# Patient Record
Sex: Female | Born: 1950
Health system: Southern US, Community
[De-identification: ages and names within clinical notes are randomized; demographics above are authoritative.]

## PROBLEM LIST (undated history)

## (undated) ENCOUNTER — Ambulatory Visit

## (undated) DIAGNOSIS — K219 Gastro-esophageal reflux disease without esophagitis: Secondary | ICD-10-CM

## (undated) DIAGNOSIS — C50919 Malignant neoplasm of unspecified site of unspecified female breast: Secondary | ICD-10-CM

## (undated) DIAGNOSIS — M1990 Unspecified osteoarthritis, unspecified site: Secondary | ICD-10-CM

## (undated) DIAGNOSIS — I10 Essential (primary) hypertension: Secondary | ICD-10-CM

## (undated) DIAGNOSIS — Z87442 Personal history of urinary calculi: Secondary | ICD-10-CM

## (undated) DIAGNOSIS — H269 Unspecified cataract: Secondary | ICD-10-CM

## (undated) DIAGNOSIS — Z5189 Encounter for other specified aftercare: Secondary | ICD-10-CM

## (undated) DIAGNOSIS — E119 Type 2 diabetes mellitus without complications: Secondary | ICD-10-CM

## (undated) HISTORY — PX: EYE SURGERY: SHX253

## (undated) HISTORY — PX: BREAST SURGERY: SHX581

## (undated) HISTORY — PX: CHOLECYSTECTOMY: SHX55

## (undated) HISTORY — PX: TUBAL LIGATION: SHX77

## (undated) HISTORY — DX: Type 2 diabetes mellitus without complications: E11.9

## (undated) HISTORY — DX: Gastro-esophageal reflux disease without esophagitis: K21.9

## (undated) HISTORY — PX: MASTECTOMY: SHX3

## (undated) HISTORY — DX: Unspecified cataract: H26.9

## (undated) HISTORY — DX: Unspecified osteoarthritis, unspecified site: M19.90

## (undated) HISTORY — DX: Encounter for other specified aftercare: Z51.89

---

## 1998-08-13 ENCOUNTER — Ambulatory Visit (HOSPITAL_COMMUNITY): Admission: RE | Admit: 1998-08-13 | Discharge: 1998-08-13 | Payer: Self-pay | Admitting: *Deleted

## 1999-04-18 ENCOUNTER — Ambulatory Visit (HOSPITAL_COMMUNITY): Admission: RE | Admit: 1999-04-18 | Discharge: 1999-04-18 | Payer: Self-pay | Admitting: *Deleted

## 2000-01-29 ENCOUNTER — Encounter: Payer: Self-pay | Admitting: Oncology

## 2000-01-29 ENCOUNTER — Encounter: Admission: RE | Admit: 2000-01-29 | Discharge: 2000-01-29 | Payer: Self-pay | Admitting: Oncology

## 2000-07-06 ENCOUNTER — Encounter: Admission: RE | Admit: 2000-07-06 | Discharge: 2000-07-06 | Payer: Self-pay | Admitting: Oncology

## 2000-07-06 ENCOUNTER — Encounter: Payer: Self-pay | Admitting: Oncology

## 2000-08-30 ENCOUNTER — Encounter: Admission: RE | Admit: 2000-08-30 | Discharge: 2000-09-04 | Payer: Self-pay | Admitting: Pulmonary Disease

## 2000-12-09 ENCOUNTER — Encounter: Payer: Self-pay | Admitting: Oncology

## 2000-12-09 ENCOUNTER — Encounter: Admission: RE | Admit: 2000-12-09 | Discharge: 2000-12-09 | Payer: Self-pay | Admitting: Oncology

## 2001-03-10 ENCOUNTER — Ambulatory Visit (HOSPITAL_COMMUNITY): Admission: RE | Admit: 2001-03-10 | Discharge: 2001-03-10 | Payer: Self-pay | Admitting: Pulmonary Disease

## 2001-04-14 ENCOUNTER — Ambulatory Visit (HOSPITAL_COMMUNITY): Admission: RE | Admit: 2001-04-14 | Discharge: 2001-04-14 | Payer: Self-pay | Admitting: Pulmonary Disease

## 2001-05-03 ENCOUNTER — Other Ambulatory Visit: Admission: RE | Admit: 2001-05-03 | Discharge: 2001-05-03 | Payer: Self-pay | Admitting: Dermatology

## 2001-07-16 ENCOUNTER — Encounter: Payer: Self-pay | Admitting: Podiatry

## 2001-07-19 ENCOUNTER — Ambulatory Visit (HOSPITAL_COMMUNITY): Admission: RE | Admit: 2001-07-19 | Discharge: 2001-07-19 | Payer: Self-pay | Admitting: Podiatry

## 2001-07-19 ENCOUNTER — Encounter: Payer: Self-pay | Admitting: Podiatry

## 2001-07-20 ENCOUNTER — Observation Stay (HOSPITAL_COMMUNITY): Admission: AD | Admit: 2001-07-20 | Discharge: 2001-07-21 | Payer: Self-pay | Admitting: Pulmonary Disease

## 2001-07-22 ENCOUNTER — Encounter (HOSPITAL_COMMUNITY): Admission: RE | Admit: 2001-07-22 | Discharge: 2001-08-21 | Payer: Self-pay | Admitting: Pulmonary Disease

## 2001-08-30 ENCOUNTER — Encounter: Payer: Self-pay | Admitting: Oncology

## 2001-08-30 ENCOUNTER — Ambulatory Visit (HOSPITAL_COMMUNITY): Admission: RE | Admit: 2001-08-30 | Discharge: 2001-08-30 | Payer: Self-pay | Admitting: Oncology

## 2002-03-21 ENCOUNTER — Ambulatory Visit (HOSPITAL_COMMUNITY): Admission: RE | Admit: 2002-03-21 | Discharge: 2002-03-21 | Payer: Self-pay | Admitting: Pulmonary Disease

## 2002-07-15 ENCOUNTER — Ambulatory Visit (HOSPITAL_COMMUNITY): Admission: RE | Admit: 2002-07-15 | Discharge: 2002-07-15 | Payer: Self-pay | Admitting: Pulmonary Disease

## 2002-08-31 ENCOUNTER — Encounter: Payer: Self-pay | Admitting: *Deleted

## 2002-08-31 ENCOUNTER — Ambulatory Visit (HOSPITAL_COMMUNITY): Admission: RE | Admit: 2002-08-31 | Discharge: 2002-08-31 | Payer: Self-pay | Admitting: *Deleted

## 2003-04-21 ENCOUNTER — Ambulatory Visit (HOSPITAL_COMMUNITY): Admission: RE | Admit: 2003-04-21 | Discharge: 2003-04-21 | Payer: Self-pay | Admitting: Pulmonary Disease

## 2003-05-02 ENCOUNTER — Encounter (HOSPITAL_COMMUNITY): Admission: RE | Admit: 2003-05-02 | Discharge: 2003-06-01 | Payer: Self-pay | Admitting: Pulmonary Disease

## 2003-05-19 ENCOUNTER — Observation Stay (HOSPITAL_COMMUNITY): Admission: RE | Admit: 2003-05-19 | Discharge: 2003-05-20 | Payer: Self-pay | Admitting: General Surgery

## 2003-09-04 ENCOUNTER — Ambulatory Visit (HOSPITAL_COMMUNITY): Admission: RE | Admit: 2003-09-04 | Discharge: 2003-09-04 | Payer: Self-pay | Admitting: Hematology and Oncology

## 2004-01-09 ENCOUNTER — Encounter (HOSPITAL_COMMUNITY): Admission: RE | Admit: 2004-01-09 | Discharge: 2004-04-08 | Payer: Self-pay | Admitting: Hematology and Oncology

## 2004-06-27 ENCOUNTER — Ambulatory Visit (HOSPITAL_COMMUNITY): Admission: RE | Admit: 2004-06-27 | Discharge: 2004-06-27 | Payer: Self-pay | Admitting: Obstetrics & Gynecology

## 2004-07-08 ENCOUNTER — Other Ambulatory Visit: Admission: RE | Admit: 2004-07-08 | Discharge: 2004-07-08 | Payer: Self-pay | Admitting: Obstetrics & Gynecology

## 2004-08-12 ENCOUNTER — Ambulatory Visit (HOSPITAL_COMMUNITY): Admission: RE | Admit: 2004-08-12 | Discharge: 2004-08-12 | Payer: Self-pay

## 2004-08-20 ENCOUNTER — Ambulatory Visit (HOSPITAL_COMMUNITY): Admission: RE | Admit: 2004-08-20 | Discharge: 2004-08-20 | Payer: Self-pay | Admitting: Pulmonary Disease

## 2004-08-27 ENCOUNTER — Ambulatory Visit (HOSPITAL_COMMUNITY): Admission: RE | Admit: 2004-08-27 | Discharge: 2004-08-27 | Payer: Self-pay | Admitting: Pulmonary Disease

## 2004-09-02 ENCOUNTER — Ambulatory Visit (HOSPITAL_COMMUNITY): Admission: RE | Admit: 2004-09-02 | Discharge: 2004-09-02 | Payer: Self-pay | Admitting: Hematology and Oncology

## 2004-09-04 ENCOUNTER — Ambulatory Visit: Payer: Self-pay | Admitting: Orthopedic Surgery

## 2004-09-26 ENCOUNTER — Ambulatory Visit: Payer: Self-pay | Admitting: Orthopedic Surgery

## 2004-10-16 ENCOUNTER — Ambulatory Visit: Payer: Self-pay | Admitting: Orthopedic Surgery

## 2004-10-25 ENCOUNTER — Ambulatory Visit (HOSPITAL_COMMUNITY): Admission: RE | Admit: 2004-10-25 | Discharge: 2004-10-25 | Payer: Self-pay | Admitting: Orthopaedic Surgery

## 2004-10-25 ENCOUNTER — Ambulatory Visit: Payer: Self-pay | Admitting: Orthopedic Surgery

## 2004-10-28 ENCOUNTER — Ambulatory Visit: Payer: Self-pay | Admitting: Orthopedic Surgery

## 2004-10-29 ENCOUNTER — Encounter (HOSPITAL_COMMUNITY): Admission: RE | Admit: 2004-10-29 | Discharge: 2004-11-28 | Payer: Self-pay | Admitting: Orthopedic Surgery

## 2004-11-20 ENCOUNTER — Ambulatory Visit: Payer: Self-pay | Admitting: Orthopedic Surgery

## 2004-11-29 ENCOUNTER — Encounter (HOSPITAL_COMMUNITY): Admission: RE | Admit: 2004-11-29 | Discharge: 2004-12-29 | Payer: Self-pay | Admitting: Orthopedic Surgery

## 2004-12-16 ENCOUNTER — Ambulatory Visit: Payer: Self-pay | Admitting: Orthopedic Surgery

## 2005-04-21 ENCOUNTER — Ambulatory Visit (HOSPITAL_COMMUNITY): Admission: RE | Admit: 2005-04-21 | Discharge: 2005-04-21 | Payer: Self-pay

## 2005-09-04 ENCOUNTER — Ambulatory Visit (HOSPITAL_COMMUNITY): Admission: RE | Admit: 2005-09-04 | Discharge: 2005-09-04 | Payer: Self-pay | Admitting: Pulmonary Disease

## 2005-09-27 ENCOUNTER — Emergency Department (HOSPITAL_COMMUNITY): Admission: EM | Admit: 2005-09-27 | Discharge: 2005-09-27 | Payer: Self-pay | Admitting: Emergency Medicine

## 2005-09-30 ENCOUNTER — Ambulatory Visit (HOSPITAL_COMMUNITY): Admission: RE | Admit: 2005-09-30 | Discharge: 2005-09-30 | Payer: Self-pay | Admitting: Pulmonary Disease

## 2005-12-29 ENCOUNTER — Ambulatory Visit (HOSPITAL_COMMUNITY): Admission: RE | Admit: 2005-12-29 | Discharge: 2005-12-29 | Payer: Self-pay | Admitting: Pulmonary Disease

## 2006-05-27 ENCOUNTER — Ambulatory Visit (HOSPITAL_COMMUNITY): Admission: RE | Admit: 2006-05-27 | Discharge: 2006-05-27 | Payer: Self-pay | Admitting: Pulmonary Disease

## 2006-09-07 ENCOUNTER — Ambulatory Visit (HOSPITAL_COMMUNITY): Admission: RE | Admit: 2006-09-07 | Discharge: 2006-09-07 | Payer: Self-pay | Admitting: Hematology and Oncology

## 2006-09-16 ENCOUNTER — Ambulatory Visit (HOSPITAL_COMMUNITY): Admission: RE | Admit: 2006-09-16 | Discharge: 2006-09-16 | Payer: Self-pay | Admitting: Hematology and Oncology

## 2006-10-20 HISTORY — PX: COLONOSCOPY: SHX174

## 2006-10-29 ENCOUNTER — Ambulatory Visit (HOSPITAL_COMMUNITY): Admission: RE | Admit: 2006-10-29 | Discharge: 2006-10-29 | Payer: Self-pay | Admitting: Neurology

## 2006-11-15 ENCOUNTER — Emergency Department (HOSPITAL_COMMUNITY): Admission: EM | Admit: 2006-11-15 | Discharge: 2006-11-15 | Payer: Self-pay | Admitting: Emergency Medicine

## 2006-12-17 ENCOUNTER — Encounter: Admission: RE | Admit: 2006-12-17 | Discharge: 2006-12-17 | Payer: Self-pay | Admitting: Neurology

## 2007-01-10 ENCOUNTER — Emergency Department (HOSPITAL_COMMUNITY): Admission: EM | Admit: 2007-01-10 | Discharge: 2007-01-10 | Payer: Self-pay | Admitting: Emergency Medicine

## 2007-01-13 ENCOUNTER — Ambulatory Visit (HOSPITAL_COMMUNITY): Admission: RE | Admit: 2007-01-13 | Discharge: 2007-01-13 | Payer: Self-pay | Admitting: Pulmonary Disease

## 2007-05-20 ENCOUNTER — Ambulatory Visit (HOSPITAL_COMMUNITY): Admission: RE | Admit: 2007-05-20 | Discharge: 2007-05-20 | Payer: Self-pay | Admitting: Pulmonary Disease

## 2007-05-27 ENCOUNTER — Ambulatory Visit: Payer: Self-pay | Admitting: Internal Medicine

## 2007-06-10 ENCOUNTER — Ambulatory Visit: Payer: Self-pay | Admitting: Internal Medicine

## 2007-06-10 ENCOUNTER — Encounter: Payer: Self-pay | Admitting: Internal Medicine

## 2007-06-10 ENCOUNTER — Ambulatory Visit (HOSPITAL_COMMUNITY): Admission: RE | Admit: 2007-06-10 | Discharge: 2007-06-10 | Payer: Self-pay | Admitting: Internal Medicine

## 2007-09-10 ENCOUNTER — Ambulatory Visit (HOSPITAL_COMMUNITY): Admission: RE | Admit: 2007-09-10 | Discharge: 2007-09-10 | Payer: Self-pay | Admitting: Hematology and Oncology

## 2008-03-20 ENCOUNTER — Ambulatory Visit (HOSPITAL_COMMUNITY): Admission: RE | Admit: 2008-03-20 | Discharge: 2008-03-20 | Payer: Self-pay | Admitting: Pulmonary Disease

## 2008-03-21 ENCOUNTER — Ambulatory Visit: Payer: Self-pay | Admitting: Cardiology

## 2008-07-19 ENCOUNTER — Ambulatory Visit (HOSPITAL_COMMUNITY): Admission: RE | Admit: 2008-07-19 | Discharge: 2008-07-19 | Payer: Self-pay | Admitting: Pulmonary Disease

## 2008-09-20 ENCOUNTER — Ambulatory Visit (HOSPITAL_COMMUNITY): Admission: RE | Admit: 2008-09-20 | Discharge: 2008-09-20 | Payer: Self-pay | Admitting: Hematology and Oncology

## 2008-11-22 ENCOUNTER — Encounter: Admission: RE | Admit: 2008-11-22 | Discharge: 2008-11-22 | Payer: Self-pay | Admitting: Otolaryngology

## 2009-05-02 ENCOUNTER — Ambulatory Visit (HOSPITAL_COMMUNITY): Admission: RE | Admit: 2009-05-02 | Discharge: 2009-05-02 | Payer: Self-pay | Admitting: Pulmonary Disease

## 2009-10-08 ENCOUNTER — Ambulatory Visit (HOSPITAL_COMMUNITY): Admission: RE | Admit: 2009-10-08 | Discharge: 2009-10-08 | Payer: Self-pay | Admitting: Pulmonary Disease

## 2009-10-16 DIAGNOSIS — M19041 Primary osteoarthritis, right hand: Secondary | ICD-10-CM | POA: Insufficient documentation

## 2009-10-16 DIAGNOSIS — G589 Mononeuropathy, unspecified: Secondary | ICD-10-CM | POA: Insufficient documentation

## 2009-10-16 DIAGNOSIS — M129 Arthropathy, unspecified: Secondary | ICD-10-CM | POA: Insufficient documentation

## 2009-10-16 DIAGNOSIS — I152 Hypertension secondary to endocrine disorders: Secondary | ICD-10-CM | POA: Insufficient documentation

## 2009-10-16 DIAGNOSIS — I10 Essential (primary) hypertension: Secondary | ICD-10-CM

## 2009-10-16 DIAGNOSIS — K219 Gastro-esophageal reflux disease without esophagitis: Secondary | ICD-10-CM

## 2009-10-16 DIAGNOSIS — G43909 Migraine, unspecified, not intractable, without status migrainosus: Secondary | ICD-10-CM | POA: Insufficient documentation

## 2009-10-16 DIAGNOSIS — C50919 Malignant neoplasm of unspecified site of unspecified female breast: Secondary | ICD-10-CM | POA: Insufficient documentation

## 2010-01-09 ENCOUNTER — Ambulatory Visit (HOSPITAL_COMMUNITY): Admission: RE | Admit: 2010-01-09 | Discharge: 2010-01-09 | Payer: Self-pay | Admitting: Pulmonary Disease

## 2010-08-22 ENCOUNTER — Ambulatory Visit (HOSPITAL_COMMUNITY)
Admission: RE | Admit: 2010-08-22 | Discharge: 2010-08-22 | Payer: Self-pay | Source: Home / Self Care | Admitting: Pulmonary Disease

## 2010-09-17 ENCOUNTER — Encounter
Admission: RE | Admit: 2010-09-17 | Discharge: 2010-09-17 | Payer: Self-pay | Source: Home / Self Care | Admitting: Surgery

## 2010-09-30 ENCOUNTER — Encounter
Admission: RE | Admit: 2010-09-30 | Discharge: 2010-09-30 | Payer: Self-pay | Source: Home / Self Care | Attending: Surgery | Admitting: Surgery

## 2010-10-02 ENCOUNTER — Ambulatory Visit
Admission: RE | Admit: 2010-10-02 | Discharge: 2010-10-02 | Payer: Self-pay | Source: Home / Self Care | Attending: Surgery | Admitting: Surgery

## 2010-10-10 ENCOUNTER — Ambulatory Visit (HOSPITAL_COMMUNITY): Admission: RE | Admit: 2010-10-10 | Payer: Self-pay | Source: Home / Self Care | Admitting: Hematology and Oncology

## 2010-11-10 ENCOUNTER — Encounter: Payer: Self-pay | Admitting: Hematology and Oncology

## 2010-11-19 ENCOUNTER — Other Ambulatory Visit: Payer: Self-pay | Admitting: Hematology and Oncology

## 2010-11-19 DIAGNOSIS — Z9011 Acquired absence of right breast and nipple: Secondary | ICD-10-CM

## 2010-12-31 LAB — COMPREHENSIVE METABOLIC PANEL
ALT: 27 U/L (ref 0–35)
Albumin: 3.5 g/dL (ref 3.5–5.2)
BUN: 17 mg/dL (ref 6–23)
CO2: 27 mEq/L (ref 19–32)
Calcium: 9.6 mg/dL (ref 8.4–10.5)
Creatinine, Ser: 0.76 mg/dL (ref 0.4–1.2)
GFR calc Af Amer: >60 mL/min (ref >60)
GFR calc non Af Amer: >60 mL/min (ref >60)
Potassium: 4.7 mEq/L (ref 3.5–5.1)
Sodium: 139 mEq/L (ref 135–145)
Total Bilirubin: 0.6 mg/dL (ref 0.3–1.2)

## 2010-12-31 LAB — DIFFERENTIAL
Basophils Absolute: 0 10*3/uL (ref 0.0–0.1)
Basophils Relative: 0 % (ref 0–1)
Eosinophils Absolute: 0.1 10*3/uL (ref 0.0–0.7)
Neutro Abs: 5 10*3/uL (ref 1.7–7.7)

## 2010-12-31 LAB — CBC
Hemoglobin: 13.7 g/dL (ref 12.0–15.0)
MCV: 88.8 fL (ref 78.0–100.0)
RBC: 4.66 MIL/uL (ref 3.87–5.11)
RDW: 14.7 % (ref 11.5–15.5)

## 2011-01-13 ENCOUNTER — Ambulatory Visit
Admission: RE | Admit: 2011-01-13 | Discharge: 2011-01-13 | Disposition: A | Discharge status: Home / Self Care | Payer: Medicare Other | Source: Ambulatory Visit | Attending: Hematology and Oncology | Admitting: Hematology and Oncology

## 2011-01-13 DIAGNOSIS — Z9011 Acquired absence of right breast and nipple: Secondary | ICD-10-CM

## 2011-01-16 DIAGNOSIS — R002 Palpitations: Secondary | ICD-10-CM

## 2011-01-21 ENCOUNTER — Other Ambulatory Visit: Payer: Self-pay | Admitting: Hematology and Oncology

## 2011-01-21 DIAGNOSIS — Z853 Personal history of malignant neoplasm of breast: Secondary | ICD-10-CM

## 2011-01-21 DIAGNOSIS — Z1231 Encounter for screening mammogram for malignant neoplasm of breast: Secondary | ICD-10-CM

## 2011-01-27 ENCOUNTER — Emergency Department (HOSPITAL_COMMUNITY)
Admission: EM | Admit: 2011-01-27 | Discharge: 2011-01-27 | Disposition: A | Discharge status: Home / Self Care | Payer: Medicare Other | Attending: Emergency Medicine | Admitting: Emergency Medicine

## 2011-01-27 ENCOUNTER — Emergency Department (HOSPITAL_COMMUNITY): Payer: Medicare Other

## 2011-01-27 ENCOUNTER — Encounter (HOSPITAL_COMMUNITY): Payer: Self-pay | Admitting: Radiology

## 2011-01-27 DIAGNOSIS — G8929 Other chronic pain: Secondary | ICD-10-CM | POA: Insufficient documentation

## 2011-01-27 DIAGNOSIS — Z79899 Other long term (current) drug therapy: Secondary | ICD-10-CM | POA: Insufficient documentation

## 2011-01-27 DIAGNOSIS — M545 Low back pain, unspecified: Secondary | ICD-10-CM | POA: Insufficient documentation

## 2011-01-27 DIAGNOSIS — R109 Unspecified abdominal pain: Secondary | ICD-10-CM | POA: Insufficient documentation

## 2011-01-27 DIAGNOSIS — C50919 Malignant neoplasm of unspecified site of unspecified female breast: Secondary | ICD-10-CM | POA: Insufficient documentation

## 2011-01-27 DIAGNOSIS — Z853 Personal history of malignant neoplasm of breast: Secondary | ICD-10-CM | POA: Insufficient documentation

## 2011-01-27 HISTORY — DX: Malignant neoplasm of unspecified site of unspecified female breast: C50.919

## 2011-01-27 LAB — URINALYSIS, ROUTINE W REFLEX MICROSCOPIC
Bilirubin Urine: NEGATIVE
Glucose, UA: NEGATIVE mg/dL
Hgb urine dipstick: NEGATIVE
Ketones, ur: NEGATIVE mg/dL
Specific Gravity, Urine: >1.03 — ABNORMAL HIGH (ref 1.005–1.030)
Urobilinogen, UA: 0.2 mg/dL (ref 0.0–1.0)

## 2011-02-08 ENCOUNTER — Emergency Department (HOSPITAL_COMMUNITY): Payer: Medicare Other

## 2011-02-08 ENCOUNTER — Emergency Department (HOSPITAL_COMMUNITY)
Admission: EM | Admit: 2011-02-08 | Discharge: 2011-02-08 | Disposition: A | Discharge status: Home / Self Care | Payer: Medicare Other | Attending: Emergency Medicine | Admitting: Emergency Medicine

## 2011-02-08 DIAGNOSIS — R071 Chest pain on breathing: Secondary | ICD-10-CM | POA: Insufficient documentation

## 2011-02-08 DIAGNOSIS — Z853 Personal history of malignant neoplasm of breast: Secondary | ICD-10-CM | POA: Insufficient documentation

## 2011-02-08 DIAGNOSIS — R109 Unspecified abdominal pain: Secondary | ICD-10-CM | POA: Insufficient documentation

## 2011-02-08 DIAGNOSIS — I10 Essential (primary) hypertension: Secondary | ICD-10-CM | POA: Insufficient documentation

## 2011-02-08 LAB — URINALYSIS, ROUTINE W REFLEX MICROSCOPIC
Bilirubin Urine: NEGATIVE
Glucose, UA: NEGATIVE mg/dL
Hgb urine dipstick: NEGATIVE
Ketones, ur: NEGATIVE mg/dL
Protein, ur: NEGATIVE mg/dL
Urobilinogen, UA: 0.2 mg/dL (ref 0.0–1.0)

## 2011-02-08 LAB — COMPREHENSIVE METABOLIC PANEL
ALT: 28 U/L (ref 0–35)
AST: 24 U/L (ref 0–37)
CO2: 24 mEq/L (ref 19–32)
Chloride: 102 mEq/L (ref 96–112)
Creatinine, Ser: 0.67 mg/dL (ref 0.4–1.2)
GFR calc Af Amer: >60 mL/min (ref >60)
GFR calc non Af Amer: >60 mL/min (ref >60)
Glucose, Bld: 93 mg/dL (ref 70–99)
Total Bilirubin: 0.6 mg/dL (ref 0.3–1.2)

## 2011-02-08 LAB — DIFFERENTIAL
Basophils Relative: 0 % (ref 0–1)
Lymphocytes Relative: 29 % (ref 12–46)
Lymphs Abs: 2 10*3/uL (ref 0.7–4.0)
Monocytes Absolute: 0.4 10*3/uL (ref 0.1–1.0)
Monocytes Relative: 5 % (ref 3–12)
Neutro Abs: 4.5 10*3/uL (ref 1.7–7.7)
Neutrophils Relative %: 64 % (ref 43–77)

## 2011-02-08 LAB — CBC
HCT: 41.3 % (ref 36.0–46.0)
Hemoglobin: 13.7 g/dL (ref 12.0–15.0)
MCH: 28.8 pg (ref 26.0–34.0)
MCHC: 33.2 g/dL (ref 30.0–36.0)
RBC: 4.75 MIL/uL (ref 3.87–5.11)

## 2011-02-08 LAB — LIPASE, BLOOD: Lipase: 17 U/L (ref 11–59)

## 2011-02-08 LAB — URINE MICROSCOPIC-ADD ON

## 2011-02-17 ENCOUNTER — Other Ambulatory Visit (HOSPITAL_COMMUNITY): Payer: Self-pay | Admitting: Pulmonary Disease

## 2011-02-17 DIAGNOSIS — S2232XA Fracture of one rib, left side, initial encounter for closed fracture: Secondary | ICD-10-CM

## 2011-02-18 ENCOUNTER — Encounter (HOSPITAL_COMMUNITY): Payer: Self-pay

## 2011-02-18 ENCOUNTER — Encounter (HOSPITAL_COMMUNITY): Payer: Medicare Other

## 2011-02-18 ENCOUNTER — Encounter (HOSPITAL_COMMUNITY)
Admission: RE | Admit: 2011-02-18 | Discharge: 2011-02-18 | Disposition: A | Discharge status: Home / Self Care | Payer: Medicare Other | Source: Ambulatory Visit | Attending: Pulmonary Disease | Admitting: Pulmonary Disease

## 2011-02-18 ENCOUNTER — Other Ambulatory Visit (HOSPITAL_COMMUNITY): Payer: Self-pay | Admitting: Pulmonary Disease

## 2011-02-18 DIAGNOSIS — S2232XA Fracture of one rib, left side, initial encounter for closed fracture: Secondary | ICD-10-CM

## 2011-02-18 DIAGNOSIS — Z853 Personal history of malignant neoplasm of breast: Secondary | ICD-10-CM | POA: Insufficient documentation

## 2011-02-18 DIAGNOSIS — R0789 Other chest pain: Secondary | ICD-10-CM | POA: Insufficient documentation

## 2011-02-18 DIAGNOSIS — M549 Dorsalgia, unspecified: Secondary | ICD-10-CM | POA: Insufficient documentation

## 2011-02-18 HISTORY — DX: Essential (primary) hypertension: I10

## 2011-02-18 MED ORDER — TECHNETIUM TC 99M MEDRONATE IV KIT
25.0000 | PACK | Freq: Once | INTRAVENOUS | Status: AC | PRN
  Administered 2011-02-18: 23.5 via INTRAVENOUS

## 2011-02-25 ENCOUNTER — Other Ambulatory Visit (HOSPITAL_COMMUNITY): Payer: Self-pay | Admitting: Pulmonary Disease

## 2011-02-25 DIAGNOSIS — R109 Unspecified abdominal pain: Secondary | ICD-10-CM

## 2011-02-25 DIAGNOSIS — M549 Dorsalgia, unspecified: Secondary | ICD-10-CM

## 2011-02-26 ENCOUNTER — Ambulatory Visit (HOSPITAL_COMMUNITY)
Admission: RE | Admit: 2011-02-26 | Discharge: 2011-02-26 | Disposition: A | Discharge status: Home / Self Care | Payer: Medicare Other | Source: Ambulatory Visit | Attending: Pulmonary Disease | Admitting: Pulmonary Disease

## 2011-02-26 DIAGNOSIS — R109 Unspecified abdominal pain: Secondary | ICD-10-CM

## 2011-02-26 DIAGNOSIS — M5124 Other intervertebral disc displacement, thoracic region: Secondary | ICD-10-CM | POA: Insufficient documentation

## 2011-02-26 DIAGNOSIS — R10A0 Flank pain, unspecified side: Secondary | ICD-10-CM

## 2011-02-26 DIAGNOSIS — R1032 Left lower quadrant pain: Secondary | ICD-10-CM | POA: Insufficient documentation

## 2011-02-26 DIAGNOSIS — K7689 Other specified diseases of liver: Secondary | ICD-10-CM | POA: Insufficient documentation

## 2011-02-26 DIAGNOSIS — Z853 Personal history of malignant neoplasm of breast: Secondary | ICD-10-CM | POA: Insufficient documentation

## 2011-02-26 DIAGNOSIS — M546 Pain in thoracic spine: Secondary | ICD-10-CM | POA: Insufficient documentation

## 2011-02-26 DIAGNOSIS — M549 Dorsalgia, unspecified: Secondary | ICD-10-CM

## 2011-02-26 DIAGNOSIS — K5730 Diverticulosis of large intestine without perforation or abscess without bleeding: Secondary | ICD-10-CM | POA: Insufficient documentation

## 2011-02-26 MED ORDER — IOHEXOL 300 MG/ML  SOLN
100.0000 mL | Freq: Once | INTRAMUSCULAR | Status: AC | PRN
  Administered 2011-02-26: 100 mL via INTRAVENOUS

## 2011-03-03 ENCOUNTER — Encounter: Payer: Medicare Other | Admitting: Genetic Counselor

## 2011-03-04 NOTE — Consult Note (Signed)
NAME:  Lauren Sullivan, Lauren Sullivan                  ACCOUNT NO.:  [id number]   MEDICAL RECORD NO.:  [id number]          PATIENT TYPE:  AMB   LOCATION:  DAY                           FACILITY:  [facility]   PHYSICIAN:  R. Roetta Sessions, M.D. DATE OF BIRTH:  21-Apr-1951   DATE OF CONSULTATION:  05/27/2007  DATE OF DISCHARGE:                                 CONSULTATION   REASON FOR CONSULTATION:  Abdominal pain, diarrhea.   HISTORY OF PRESENT ILLNESS:  Lauren Sullivan is a 60 year old Caucasian female who  presents today for further evaluation of above stated symptoms.  For 6  weeks or more, she has had diarrhea and abdominal pain.  She is having  approximately 3-4 days a week where she has up to 10 stools a day which  are associated with fresh blood.  She describes the bleeding as small  volume.  Other days, she may have a lesser number of stools, but stools  are still soft.  She denies any constipation.  She has mid abdominal  pain which is intermittent in nature.  Does not tend to be relieved with  defecation.  She has a great appetite.  No nausea or vomiting or weight  loss.  Her GERD symptoms are well-controlled on Aciphex.  She denies any  new medications.  No recent antibiotic use.  The only thing new she  remembers, in May 2008 she received epidural injections in her neck for  bulging disk.  She had a CT of the abdomen and pelvis last week which  revealed fatty infiltration of the liver but nothing to explain her  symptoms.  She reports having blood work and stool studies which were  unremarkable as well.  We are trying retrieve these records.  Her last  colonoscopy was done in [place] back in 1994.  She does not recall  any abnormal findings.   CURRENT MEDICATIONS:  1. Micardis 80/25 mg daily.  2. Naproxen 500 mg b.i.d.  3. Nortriptyline 50 mg daily .  4. Evista 60 mg daily.  5. Aciphex 20 mg daily.  6. Topamax 50 mg daily.  7. Lortab 500 mg p.r.n.   ALLERGIES:  No known drug allergies.   PAST  MEDICAL HISTORY:  1. History of remote right breast cancer status post mastectomy and      chemotherapy in early 90s.  2. She has neuropathy, felt to be related to prior chemotherapy.  3. Arthritis.  4. Hypertension.  5. GERD.  6. Migraine headaches.  7. A ruptured disk in her neck.  8. She had a cholecystectomies about 4 years ago.  9. Right knee arthroscopy 2 years ago.  10.Colonoscopy in 1994 as outlined above.   FAMILY HISTORY:  Mother deceased at age 71 due to pancreatic cancer.  Father deceased age 89 due to accidental shooting.  She has a sister who  died from metastatic lung cancer.  No family history of colorectal  cancer.   SOCIAL HISTORY:  She is married.  She has two grown daughters.  She  works part-time at [employer].  Her husband is the  minister  there.  She is a remote smoker, but only smoked for couple of years in  her 59s.  Denies any alcohol use.   REVIEW OF SYSTEMS:  See HPI for GI and Constitutional.  CARDIOPULMONARY:  She denies any chest pain or shortness of breath.  GENITOURINARY:  She  denies any dysuria, hematuria.   PHYSICAL EXAMINATION:  VITAL SIGNS:  Weight 245 pounds, height 5 feet 6  inches, temperature 98.4, blood pressure 118/90, pulse 84.  GENERAL:  Pleasant, well-nourished, well-developed Caucasian female in  no acute distress.  SKIN:  Warm and dry.  No jaundice.  HEENT:  Sclerae nonicteric.  Oropharyngeal and mucosa moist and pink.  No lesions, erythema or exudate.  No lymphadenopathy, thyromegaly.  CHEST:  Lungs clear to auscultation.  CARDIAC:  Exam reveals regular rate and rhythm.  Normal S1-S2.  No  murmurs, rubs or gallops.  ABDOMEN:  Positive bowel sounds, soft, nontender, nondistended.  No  organomegaly or masses.  No guarding.  No abdominal bruits or hernias.  She had mild lower abdominal tenderness to deep palpation.  No rebound  tenderness or guarding.  No abdominal bruits or hernias.  EXTREMITIES:  No edema.    IMPRESSION:  Lauren Sullivan is a 60 year old lady with a 6-week history of  diarrhea associated with a lower abdominal pain and hematochezia.  She  is on NSAIDS chronically, therefore, cannot exclude the possibility of  NSAID induced colitis.  We need to rule out other etiologies such as IBD  or infectious colitis as well.   PLAN:  1. Colonoscopy with Dr. Jena Gauss in the near future.  2. Will retrieve stool studies and labs done in Dr. Juanetta Gosling office      recently.  3. Check a CBC and celiac disease, antibody panel.  Screening for      celiac disease, and if any antibodies are positive, would consider      upper endoscopy with small bowel biopsy at time of colonoscopy.  4. Please note, the patient had voiced serious concerns about having      difficulties with sedation.  She has been on      multiple narcotics throughout the past and finds that she has      previously been difficult to sedate and is requesting anesthesia      with propofol for colonoscopy.  This was discussed with Dr. Jena Gauss      who agrees with the plan.  I would like to thank Dr. Juanetta Gosling for      allowing [place] to take part in the care of this patient.      Tana Coast, P.AJonathon Bellows, M.D.  Electronically Signed    LL/MEDQ  D:  05/27/2007  T:  05/27/2007  Job:  [id number]   cc:   Ramon Dredge L. Juanetta Gosling, M.D.  Fax: [phone]

## 2011-03-04 NOTE — Op Note (Signed)
NAME:  [name], Lauren Sullivan                  ACCOUNT NO.:  [id number]   MEDICAL RECORD NO.:  [id number]          PATIENT TYPE:  AMB   LOCATION:  DAY                           FACILITY:  [facility]   PHYSICIAN:  R. Roetta Sessions, M.D. DATE OF BIRTH:  July 04, 1951   DATE OF PROCEDURE:  06/10/2007  DATE OF DISCHARGE:                               OPERATIVE REPORT   PROCEDURE:  Colonoscopy with ileoscopy, sigmoid biopsy, stool sampling.   INDICATIONS FOR PROCEDURE:  The patient is a 60 year old lady with a  couple months history of diarrhea with intermittent episodes of small  volume hematochezia.  Colonoscopy is now being done to further evaluate  her symptoms.  Her last colonoscopy was around 64 in [place],  findings unknown, old records never materialized, although we have  attempted to get them.   DESCRIPTION OF PROCEDURE:  O2 saturation, blood pressure, pulse rate,  and respirations were monitored throughout the entire procedure.  Sedation was administered by anesthesia (propofol).   INSTRUMENT USED:  Pentax video chip system.   FINDINGS:  Digital rectal examination revealed no abnormalities.  Endoscopic findings revealed the prep was good.  Examination of the  colonic mucosa undertaken from the rectosigmoid junction through the  left, transverse, right colon, to the area of the appendiceal orifice,  ileocecal valve, and cecum.  These structures were well seen and  photographed for the record.  The terminal ileum was intubated to 20 cm.  From this level, the scope was slowly withdrawn.  All previously  mentioned mucosal surfaces were again seen.  The terminal ileum mucosa  appeared slightly boggy and edematous.  This may well be a normal  variant, certainly no erosions or ulcerations were seen.  Biopsies of  the terminal ileum mucosa was taken for histologic study.  The colon,  however, appeared entirely normal without any evidence of erosion,  ulceration, or granularity, preservation of the  normal vascular pattern  throughout the colon.  Several biopsies of the descending and sigmoid  were taken for histologic study.  A stool sample was also taken for  microbiology studies.  The scope was pulled down into the rectum where a  thorough examination of the rectal mucosa including retroflexion in the  anal verge demonstrated no abnormalities, although the mucosa to the  anal canal was somewhat friable.  Biopsies of the rectal mucosa was also  taken.  The patient tolerated the procedure well.   IMPRESSION:  Minimally friable anal canal, otherwise, normal rectum.  Endoscopically normal appearing colon.  Questionably boggy edematous  terminal ileum mucosa of uncertain significance, status post segmental  biopsy and stool sampling.   RECOMMENDATIONS:  Will follow up on path and stool studies, further  recommendations in the very near future.      Jonathon Bellows, M.D.  Electronically Signed     RMR/MEDQ  D:  06/10/2007  T:  06/10/2007  Job:  [id number]   cc:   Ramon Dredge L. Juanetta Gosling, M.D.  Fax: [phone]

## 2011-03-05 ENCOUNTER — Encounter: Payer: Self-pay | Admitting: Cardiology

## 2011-03-07 NOTE — Op Note (Signed)
NAMEJORDANA, [name]                  ACCOUNT NO.:  [id number]   MEDICAL RECORD NO.:  [id number]          PATIENT TYPE:  AMB   LOCATION:  DAY                           FACILITY:  [facility]   PHYSICIAN:  Vickki Hearing, M.D.DATE OF BIRTH:  May 01, 1951   DATE OF PROCEDURE:  10/25/2004  DATE OF DISCHARGE:                                 OPERATIVE REPORT   PREOPERATIVE DIAGNOSIS:  Medial meniscal tear, right knee.   POSTOPERATIVE DIAGNOSIS:  1.  Medial meniscal tear, right knee, two loose bodies right knee,      arthritis, right knee.   OPERATION PERFORMED:  Partial medial meniscectomy, removal of two loose  bodies and chondroplasty of the patella.   SURGEON:  Vickki Hearing, M.D.   ANESTHESIA:  General.   INDICATIONS FOR PROCEDURE:  Pain, right knee.   DESCRIPTION OF PROCEDURE:  The patient was identified in the preop holding  area as Lauren Sullivan.  The mark placed on her right knee by the patient was  counter signed by the physician.  She was given preoperative Ancef and taken  to the operating room for general anesthetic.  After this, a sterile prep  and drape was done and a time out was taken and completed as required.  During the time out, we confirmed antibiotics to be given, equipment in the  room, right knee as a surgical site, arthroscopy as the procedure and  patient as Lauren Sullivan.   Standard two incision arthroscopy technique was performed.  Diagnostic  arthroscopy was performed with viewing from the lateral portal.  In the  medial compartment, a torn medial meniscus was noted at the posterior horn.  There was a radial tear.  There was a loose body found at that time.  In the  notch area, there were some mild osteophytes and synovitis around the ACL.  The lateral compartment had minimal fraying and irregularities of the  articular surface but for the most part was benign.  The patellofemoral area  had to be debrided for visualization and there was a grade 2 lesion  on the  median ridge.   We removed the loose body and a second loose body was found.  We did a  medial meniscectomy with a straight and curved duck bill forceps and  balanced the meniscus with a motorized shaver.  We suctioned the knee of the  debris and turned our attention to the patellofemoral joint.  After the  debridement, it was noted that the trochlea and patella had chondral  lesions, grade 1 on the trochlea, grade 2 on the patella.  A straight  motorized shaver was used to perform a chondroplasty of the patella.  There  was also a decreased amount of cartilage on the lateral facet.  It was  diffuse and I would grade it as grade 3.   The knee was then irrigated, suctioned dry and injected with 30 mL of  Marcaine.  The portal sites were closed with 3-0 nylon and the dressings  were applied with an Ace bandage.  Cryocuff was applied as well.  The  patient was extubated and taken to recovery room in stable condition.   POSTOP PLAN:  Follow-up on Monday which is three days from today.     Weyman Croon   SEH/MEDQ  D:  10/25/2004  T:  10/25/2004  Job:  [id number]

## 2011-03-07 NOTE — Op Note (Signed)
NAME:  Lauren Sullivan, Lauren Sullivan                            ACCOUNT NO.:  [id number]   MEDICAL RECORD NO.:  [id number]                   PATIENT TYPE:  AMB   LOCATION:  DAY                                  FACILITY:  [facility]   PHYSICIAN:  Dalia Heading, M.D.               DATE OF BIRTH:  1951-02-25   DATE OF PROCEDURE:  05/19/2003  DATE OF DISCHARGE:                                 OPERATIVE REPORT   PREOPERATIVE DIAGNOSIS:  Chronic cholecystitis.   POSTOPERATIVE DIAGNOSIS:  Chronic cholecystitis.   PROCEDURE:  Laparoscopic cholecystectomy.   SURGEON:  Dalia Heading, M.D.   ANESTHESIA:  General endotracheal.   INDICATIONS FOR PROCEDURE:  The patient is a 60 year old white female who  was referred for evaluation and treatment of biliary colic secondary to  chronic cholecystitis. The risks and benefits of the procedure including  bleeding, infection, hepatobiliary injury, and the possibility of an open  procedure were fully explained to the patient, who gave informed consent.   DESCRIPTION OF PROCEDURE:  The patient was placed in the supine position.  After induction of general endotracheal anesthesia, the abdomen was prepped  and draped using the usual sterile technique with Betadine. Surgical site  confirmation was performed.   A supraumbilical incision was made down to the fascia. A Veress needle was  introduced into the abdominal cavity and confirmation of placement was done  using the saline drop test. The abdomen was then insufflated to 16 mmHg  pressure. An 11 mm trocar was introduced into the abdominal cavity under  direct visualization without difficulty. An additional 11 mm trocar was  placed in the epigastric region and 5 mm trocars were placed in the right  upper quadrant and right flank regions. The liver was inspected and noted to  be within normal limits. The gallbladder was retracted superiorly and  laterally. The dissection was begun around the infundibulum of the  gallbladder. The cystic duct was first identified. Its junction to the  infundibulum fully identified. Endoclips were placed proximally and distally  on the cystic duct and the cystic duct was divided. This was likewise done  on the cystic artery. The gallbladder was then freed away from the  gallbladder fossa using Bovie electrocautery. The gallbladder was delivered  through the epigastric trocar site using an EndoCatch bag. The gallbladder  fossa was inspected and no abnormal bleeding or bile leakage was noted.  Surgicel was placed in the gallbladder fossa. The subhepatic space as well  as the right hepatic gutter were irrigated with fluid. All air was then  evacuated from the abdominal cavity prior to removal of the trocars.   All wounds were irrigated with normal saline. All wounds were injected with  0.5% Sensorcaine. The supraumbilical fascia was reapproximated using an #0  Vicryl interrupted suture. All skin incisions were closed using staples.  Betadine ointment and dry sterile dressings were applied.  All tape and needle counts were correct at the end of the procedure. The  patient was extubated in the operating room and went back to the recovery  room awake in stable condition.   COMPLICATIONS:  None.   ESTIMATED BLOOD LOSS:  Minimal.                                               Dalia Heading, M.D.    MAJ/MEDQ  D:  05/19/2003  T:  05/19/2003  Job:  [id number]   cc:   Ramon Dredge L. Juanetta Gosling, M.D.  [address]  [place]  [place] [address]  Fax: [phone]

## 2011-03-07 NOTE — H&P (Signed)
   NAME:  Lauren Sullivan, Lauren Sullivan                            ACCOUNT NO.:  [id number]   MEDICAL RECORD NO.:  [id number]                  PATIENT TYPE:   LOCATION:                                       FACILITY:   PHYSICIAN:  Dalia Heading, M.D.               DATE OF BIRTH:  05/30/1936   DATE OF ADMISSION:  05/19/2003  DATE OF DISCHARGE:                                HISTORY & PHYSICAL   CHIEF COMPLAINT:  Chronic cholecystitis.   HISTORY OF PRESENT ILLNESS:  The patient is a 60 year old white female who  was referred for evaluation and treatment of biliary colic secondary to  chronic cholecystitis.  She has been having right upper quadrant abdominal  discomfort, nausea, postprandial discomfort, indigestion, and bloating for a  while.  No fever, chills, or jaundice have been noted.  The symptoms seem to  be worsening.   PAST MEDICAL HISTORY:  Right breast cancer.   PAST SURGICAL HISTORY:  Right modified radical mastectomy.   CURRENT MEDICATIONS:  1. Evista 60 mg p.o. daily.  2. Pamelor 50 mg p.o. b.i.d.  3. Penicillin 250 mg p.o. b.i.d.  4. Vicodin as needed for migraine headaches.  5. Aciphex 20 mg p.o. daily.  6. Naprosyn 500 mg p.o. b.i.d.   ALLERGIES:  VANCOMYCIN.   REVIEW OF SYSTEMS:  Noncontributory.   PHYSICAL EXAMINATION:  GENERAL APPEARANCE:  The patient is a well-developed,  well-nourished, white female in no acute distress.  VITAL SIGNS:  She is afebrile and vital signs are stable.  HEENT:  No sclerae icterus.  LUNGS:  Clear to auscultation with equal breath sounds bilaterally.  HEART:  Regular rate and rhythm without S3, S4, or murmurs.  ABDOMEN:  Soft and nondistended.  Slightly tender in the right upper  quadrant to palpation.  No hepatosplenomegaly, masses, or hernias are  identified.  Ultrasound of the gallbladder is negative.  Hepatobiliary scan  reveals chronic cholecystitis with a low gallbladder ejection fraction.   IMPRESSION:  1. Biliary colic.  2.  Chronic cholecystitis.   PLAN:  The patient is scheduled for laparoscopic cholecystectomy on May 19, 2003.  The risks and benefits of the procedure, including bleeding,  infection, hepatobiliary injury, and the possibility of an open procedure  were fully explained to the patient, who gave informed consent.                                               Dalia Heading, M.D.    MAJ/MEDQ  D:  05/11/2003  T:  05/11/2003  Job:  [id number]   cc:   Ramon Dredge L. Juanetta Gosling, M.D.  [address]  [place]  [place] [address]  Fax: [phone]

## 2011-03-07 NOTE — H&P (Signed)
[facility]  Patient:    Lauren Sullivan, Lauren Sullivan Visit Number: [id number] MRN: [id number]          Service Type: OBV Location: 3 A321 01 Attending Physician:  Fredirick Maudlin Dictated by:   Kari Baars, M.D. Admit Date:  07/20/2001   CC:         Dr. Pricilla Holm   History and Physical  BRIEF HISTORY:  This is a 60 year old who underwent bunion surgery yesterday. She has had difficulty with wearing a number of different shoes, and has requested surgical correction.  CURRENT MEDICATIONS: 1. Evista. 2. Naproxen. 3. Vicodin. 4. Pamelor. 5. Aciphex. 6. Penicillin on a daily basis because of a previous history of severe    cellulitis which was recurrent.  PAST MEDICAL HISTORY:  She has had breast cancer and has had cellulitis twice. She has has no transfusions. She does have a history of reflux esophagitis for which takes the Aciphex, arthritis for which she takes the Naprosyn, and she takes Evista for bone density purposes.  FAMILY HISTORY:  Positive for breast cancer.  PHYSICAL EXAMINATION:  GENERAL:  She has a rash on her neck with some flushing of the face.  VITAL SIGNS:  Temperature at home was 100.0.  Her blood pressure 120/70, pulse is 80.  CHEST:  Fairly clear.  HEART:  Regular.  ABDOMEN:  Soft.  EXTREMITIES:  No edema.  She has some rash on right upper arm and on her right chest wall.  BREASTS:  She has had a mastectomy.  Further history is that she had her surgery uneventfully yesterday but then she developed the rash.  She is concerned that she is developing another episode of cellulitis.  ASSESSMENT:  She has possible cellulitis.  PLAN:  I am going to put her in the hospital for observation and have her take a intravenous antibiotic and will have her follow up after that.  She is to be reevaluated after the first 24 hours of antibiotic therapy. Dictated by:   Kari Baars, M.D. Attending Physician:  Fredirick Maudlin DD:   07/20/01 TD:  07/20/01 Job: [id number]

## 2011-03-07 NOTE — H&P (Signed)
Lauren Sullivan, Lauren Sullivan                  ACCOUNT NO.:  [id number]   MEDICAL RECORD NO.:  [id number]          PATIENT TYPE:  AMB   LOCATION:  DAY                           FACILITY:  [facility]   PHYSICIAN:  Vickki Hearing, M.D.DATE OF BIRTH:  08-19-1951   DATE OF ADMISSION:  09/02/2004  DATE OF DISCHARGE:  11/14/2005LH                                HISTORY & PHYSICAL   CHIEF COMPLAINT:  Right knee pain.   This is a history and physical for surgery.   This is a 60 year old female who complains of gradual onset of right knee  pain with no obvious trauma.  She complains of swelling and diffuse pain  across the front and back of the knee with aching, stiffness and difficulty  walking, flexing and extending the knee.  MRI shows torn medial meniscus,  joint effusion with a Baker's cyst and  three-compartment arthritis.   She was treated conservatively.  She failed conservative therapy and now  presents for arthroscopy of the right knee.   REVIEW OF SYSTEMS:  Notable for a history of lymph node cancer, right-sided,  history of infection after the surgery, seasonal allergy, poor vision, joint  pain, swelling, migraines, headache, reflux, pneumonia, cough, COPD,  shortness of breath, chest pain, fever, chills and fatigue.  She has no  allergies.   MEDICAL PROBLEMS:  1.  Hypertension.  2.  She has had a mastectomy on the right.  3.  Cholecystectomy.  4.  Tubal ligation.   MEDICATIONS:  1.  Evista 50 mg.  2.  Aciphex 20 mg.  3.  Nortriptyline 50 mg.  4.  Micardis.  5.  Lortab.  6.  Penicillin, which she takes chronically.   FAMILY HISTORY:  Heart disease, arthritis and cancer.   FAMILY PHYSICIAN:  Dr. Shaune Pollack.   SOCIAL HISTORY:  Married.  No smoking or drinking.  Caffeine use:  No.  Completed her first year of college.   PHYSICAL EXAMINATION:  GENERAL APPEARANCE:  Shows a well-developed, well-  nourished, slightly overweight female whose grooming and hygiene is intact.  EXTREMITIES:  She has normal pulses and perfusion with no edema or swelling.  Her medial right knee is tender.  She has swelling and effusion in the knee.  Range of motion is 110 degrees.  Meniscal signs are equivocal, however, with  medial joint line tenderness and loss of motion and positive MRI.  She most  likely does have medial meniscal tear.  There is fullness in the back of the  knee but no evidence of DVT.  NEUROLOGIC:  Normal.  PSYCHIATRIC:  Normal.   IMPRESSION:  1.  Osteoarthritis.  2.  Torn medial meniscus, right knee.   PLAN:  1.  Arthroscopy, right knee.  2.  The patient has given informed consent for the surgery.  3.  Specific to this surgery, she accepts the complications of bleeding,      infection, stiffness, continued pain.   PROCEDURE:  Arthroscopy of the right knee.   DIAGNOSIS:  Torn medial meniscus with osteoarthritis, right knee.     Weyman Croon  SEH/MEDQ  D:  10/16/2004  T:  10/16/2004  Job:  [id number]

## 2011-03-07 NOTE — Group Therapy Note (Signed)
[facility]  Patient:    RAJVI, ARMENTOR Visit Number: [id number] MRN: [id number]          Service Type: OBV Location: 3 A321 01 Attending Physician:  Fredirick Maudlin Dictated by:   Kari Baars, M.D. Admit Date:  07/20/2001 Discharge Date: 07/21/2001                     Progress Note/EKG Interpretations  PROBLEM:  Possible cellulitis of the trunk.  SUBJECTIVE:  Ms. Meinhardt says she is feeling better.  She had more problems with pain in her foot yesterday.  Dr. Pricilla Holm has already seen her and has redressed her foot.  Her white blood count is down to 6000 today.  Her leg actually does not show a great deal of problem with swelling.  Her trunk does not show much in the way of erythema.  She and I have discussed this at length.  I think she can probably be treated as an outpatient now, so we are going to have her get a dose of rose now and then receive Rocephin 1 g IV q.24h. x 10 more days as an outpatient.  She is going to require a wheelchair because Dr. Pricilla Holm really does not want her to bear any weight on her foot.  I discussed all this with her, and she understands. Dictated by:   Kari Baars, M.D. Attending Physician:  Fredirick Maudlin DD:  07/21/01 TD:  07/21/01 Job: [id number]

## 2011-03-07 NOTE — Discharge Summary (Signed)
[facility]  Patient:    Lauren Sullivan, Lauren Sullivan Visit Number: [id number] MRN: [id number]          Service Type: OBV Location: 3 A321 01 Attending Physician:  Fredirick Maudlin Dictated by:   Kari Baars, M.D. Admit Date:  07/20/2001 Discharge Date: 07/21/2001                             Discharge Summary  DIAGNOSES: 1. Cellulitis of the chest wall. 2. Status post bunion surgery. 3. History of a mastectomy.  BRIEF HISTORY:  The patient had surgery for a bunion on the day prior to admission to the hospital.  She had previous episodes of cellulitis of the chest wall and has had a mastectomy also for breast cancer.  She was in her usual state of fairly good health, had the bunion surgery, and things went well, but then she developed fever and a rash.  Because of previous episodes of the cellulitis of the chest wall she came to my office where she was noted to have fairly marked erythema of her face and less so of her trunk.  She was brought in for hospital observation because of this.  The rest of her examination is really unremarkable except that she had her foot in a postoperative boot.  HOSPITAL COURSE:  She was given Rocephin and was noted to have a white count that was not elevated and the next morning the white count was actually even lower.  Her rash pretty much disappeared after approximately 24 hours of hospitalization and she was discharged home in improved condition to receive Rocephin 1 gram IV q.24h. for another 10 days.  She is going to follow up with Dr. Pricilla Holm who had done her foot surgery and she is going to follow up in my office on a p.r.n. basis. Dictated by:   Kari Baars, M.D. Attending Physician:  Fredirick Maudlin DD:  07/21/01 TD:  07/21/01 Job: [id number]

## 2011-03-10 ENCOUNTER — Encounter: Payer: Medicare Other | Admitting: Genetic Counselor

## 2011-08-01 LAB — OVA AND PARASITE EXAMINATION

## 2011-08-01 LAB — STOOL CULTURE

## 2011-08-01 LAB — HEMOGLOBIN AND HEMATOCRIT, BLOOD: Hemoglobin: 13.5

## 2011-08-01 LAB — BASIC METABOLIC PANEL
BUN: 13
Calcium: 8.9
GFR calc non Af Amer: >60
Glucose, Bld: 113 — ABNORMAL HIGH

## 2011-08-01 LAB — FECAL LACTOFERRIN, QUANT

## 2012-01-09 ENCOUNTER — Ambulatory Visit (HOSPITAL_COMMUNITY)
Admission: RE | Admit: 2012-01-09 | Discharge: 2012-01-09 | Disposition: A | Discharge status: Home / Self Care | Payer: Medicare Other | Source: Ambulatory Visit | Attending: Pulmonary Disease | Admitting: Pulmonary Disease

## 2012-01-09 ENCOUNTER — Other Ambulatory Visit (HOSPITAL_COMMUNITY): Payer: Self-pay | Admitting: Pulmonary Disease

## 2012-01-09 DIAGNOSIS — M25519 Pain in unspecified shoulder: Secondary | ICD-10-CM | POA: Insufficient documentation

## 2012-01-09 DIAGNOSIS — R52 Pain, unspecified: Secondary | ICD-10-CM

## 2012-01-09 DIAGNOSIS — M76899 Other specified enthesopathies of unspecified lower limb, excluding foot: Secondary | ICD-10-CM | POA: Insufficient documentation

## 2012-01-09 DIAGNOSIS — M25559 Pain in unspecified hip: Secondary | ICD-10-CM | POA: Insufficient documentation

## 2012-01-15 ENCOUNTER — Ambulatory Visit
Admission: RE | Admit: 2012-01-15 | Discharge: 2012-01-15 | Disposition: A | Discharge status: Home / Self Care | Payer: Medicare Other | Source: Ambulatory Visit | Attending: Hematology and Oncology | Admitting: Hematology and Oncology

## 2012-01-15 DIAGNOSIS — Z853 Personal history of malignant neoplasm of breast: Secondary | ICD-10-CM

## 2012-01-15 DIAGNOSIS — Z1231 Encounter for screening mammogram for malignant neoplasm of breast: Secondary | ICD-10-CM

## 2013-01-05 ENCOUNTER — Ambulatory Visit (HOSPITAL_COMMUNITY)
Admission: RE | Admit: 2013-01-05 | Discharge: 2013-01-05 | Disposition: A | Discharge status: Home / Self Care | Payer: Medicare Other | Source: Ambulatory Visit | Attending: Pulmonary Disease | Admitting: Pulmonary Disease

## 2013-01-05 ENCOUNTER — Other Ambulatory Visit (HOSPITAL_COMMUNITY): Payer: Self-pay | Admitting: Pulmonary Disease

## 2013-01-05 DIAGNOSIS — M79605 Pain in left leg: Secondary | ICD-10-CM

## 2013-01-05 DIAGNOSIS — M79609 Pain in unspecified limb: Secondary | ICD-10-CM | POA: Insufficient documentation

## 2014-02-16 ENCOUNTER — Ambulatory Visit (INDEPENDENT_AMBULATORY_CARE_PROVIDER_SITE_OTHER): Payer: Medicare Other | Admitting: Otolaryngology

## 2014-02-16 DIAGNOSIS — H9209 Otalgia, unspecified ear: Secondary | ICD-10-CM

## 2014-02-16 DIAGNOSIS — H6120 Impacted cerumen, unspecified ear: Secondary | ICD-10-CM

## 2014-02-16 DIAGNOSIS — H903 Sensorineural hearing loss, bilateral: Secondary | ICD-10-CM

## 2014-06-30 ENCOUNTER — Ambulatory Visit (HOSPITAL_COMMUNITY): Payer: Medicare Other

## 2014-06-30 ENCOUNTER — Other Ambulatory Visit (HOSPITAL_COMMUNITY): Payer: Self-pay | Admitting: Pulmonary Disease

## 2014-06-30 DIAGNOSIS — R519 Headache, unspecified: Secondary | ICD-10-CM

## 2014-06-30 DIAGNOSIS — I159 Secondary hypertension, unspecified: Secondary | ICD-10-CM

## 2014-06-30 DIAGNOSIS — R51 Headache: Secondary | ICD-10-CM

## 2014-07-12 ENCOUNTER — Ambulatory Visit (HOSPITAL_COMMUNITY)
Admission: RE | Admit: 2014-07-12 | Discharge: 2014-07-12 | Disposition: A | Discharge status: Home / Self Care | Payer: Medicare Other | Source: Ambulatory Visit | Attending: Pulmonary Disease | Admitting: Pulmonary Disease

## 2014-07-12 DIAGNOSIS — R519 Headache, unspecified: Secondary | ICD-10-CM

## 2014-07-12 DIAGNOSIS — N3289 Other specified disorders of bladder: Secondary | ICD-10-CM | POA: Insufficient documentation

## 2014-07-12 DIAGNOSIS — R51 Headache: Secondary | ICD-10-CM | POA: Diagnosis not present

## 2014-07-12 DIAGNOSIS — I158 Other secondary hypertension: Secondary | ICD-10-CM | POA: Diagnosis not present

## 2014-07-12 DIAGNOSIS — I159 Secondary hypertension, unspecified: Secondary | ICD-10-CM

## 2014-07-31 DIAGNOSIS — M7918 Myalgia, other site: Secondary | ICD-10-CM | POA: Insufficient documentation

## 2015-08-10 ENCOUNTER — Other Ambulatory Visit: Payer: Self-pay

## 2015-08-10 DIAGNOSIS — Z1231 Encounter for screening mammogram for malignant neoplasm of breast: Secondary | ICD-10-CM

## 2015-08-29 ENCOUNTER — Ambulatory Visit
Admission: RE | Admit: 2015-08-29 | Discharge: 2015-08-29 | Disposition: A | Discharge status: Home / Self Care | Payer: Medicare Other | Source: Ambulatory Visit

## 2015-08-29 DIAGNOSIS — Z1231 Encounter for screening mammogram for malignant neoplasm of breast: Secondary | ICD-10-CM

## 2016-05-02 ENCOUNTER — Encounter: Payer: Self-pay | Admitting: Genetic Counselor

## 2017-01-28 DIAGNOSIS — K219 Gastro-esophageal reflux disease without esophagitis: Secondary | ICD-10-CM | POA: Diagnosis not present

## 2017-01-28 DIAGNOSIS — M1990 Unspecified osteoarthritis, unspecified site: Secondary | ICD-10-CM | POA: Diagnosis not present

## 2017-01-28 DIAGNOSIS — I10 Essential (primary) hypertension: Secondary | ICD-10-CM | POA: Diagnosis not present

## 2017-01-28 DIAGNOSIS — E119 Type 2 diabetes mellitus without complications: Secondary | ICD-10-CM | POA: Diagnosis not present

## 2017-01-30 DIAGNOSIS — K219 Gastro-esophageal reflux disease without esophagitis: Secondary | ICD-10-CM | POA: Diagnosis not present

## 2017-01-30 DIAGNOSIS — M1990 Unspecified osteoarthritis, unspecified site: Secondary | ICD-10-CM | POA: Diagnosis not present

## 2017-01-30 DIAGNOSIS — I10 Essential (primary) hypertension: Secondary | ICD-10-CM | POA: Diagnosis not present

## 2017-01-30 DIAGNOSIS — E119 Type 2 diabetes mellitus without complications: Secondary | ICD-10-CM | POA: Diagnosis not present

## 2017-02-02 ENCOUNTER — Encounter: Payer: Self-pay | Admitting: Gastroenterology

## 2017-02-24 ENCOUNTER — Ambulatory Visit: Payer: Medicare Other | Admitting: Gastroenterology

## 2017-03-06 DIAGNOSIS — I10 Essential (primary) hypertension: Secondary | ICD-10-CM | POA: Diagnosis not present

## 2017-03-06 DIAGNOSIS — E669 Obesity, unspecified: Secondary | ICD-10-CM | POA: Diagnosis not present

## 2017-03-06 DIAGNOSIS — K219 Gastro-esophageal reflux disease without esophagitis: Secondary | ICD-10-CM | POA: Diagnosis not present

## 2017-03-06 DIAGNOSIS — E119 Type 2 diabetes mellitus without complications: Secondary | ICD-10-CM | POA: Diagnosis not present

## 2017-05-26 DIAGNOSIS — M1990 Unspecified osteoarthritis, unspecified site: Secondary | ICD-10-CM | POA: Diagnosis not present

## 2017-05-26 DIAGNOSIS — K219 Gastro-esophageal reflux disease without esophagitis: Secondary | ICD-10-CM | POA: Diagnosis not present

## 2017-05-26 DIAGNOSIS — K210 Gastro-esophageal reflux disease with esophagitis: Secondary | ICD-10-CM | POA: Diagnosis not present

## 2017-05-26 DIAGNOSIS — E669 Obesity, unspecified: Secondary | ICD-10-CM | POA: Diagnosis not present

## 2017-05-26 DIAGNOSIS — E119 Type 2 diabetes mellitus without complications: Secondary | ICD-10-CM | POA: Diagnosis not present

## 2017-05-26 DIAGNOSIS — E1165 Type 2 diabetes mellitus with hyperglycemia: Secondary | ICD-10-CM | POA: Diagnosis not present

## 2017-05-26 DIAGNOSIS — I10 Essential (primary) hypertension: Secondary | ICD-10-CM | POA: Diagnosis not present

## 2017-08-24 DIAGNOSIS — C50011 Malignant neoplasm of nipple and areola, right female breast: Secondary | ICD-10-CM | POA: Diagnosis not present

## 2017-09-14 DIAGNOSIS — Z23 Encounter for immunization: Secondary | ICD-10-CM | POA: Diagnosis not present

## 2017-09-14 DIAGNOSIS — E1165 Type 2 diabetes mellitus with hyperglycemia: Secondary | ICD-10-CM | POA: Diagnosis not present

## 2017-09-14 DIAGNOSIS — K210 Gastro-esophageal reflux disease with esophagitis: Secondary | ICD-10-CM | POA: Diagnosis not present

## 2017-09-14 DIAGNOSIS — I10 Essential (primary) hypertension: Secondary | ICD-10-CM | POA: Diagnosis not present

## 2017-09-14 DIAGNOSIS — R109 Unspecified abdominal pain: Secondary | ICD-10-CM | POA: Diagnosis not present

## 2017-10-26 DIAGNOSIS — E669 Obesity, unspecified: Secondary | ICD-10-CM | POA: Diagnosis not present

## 2017-10-26 DIAGNOSIS — E1165 Type 2 diabetes mellitus with hyperglycemia: Secondary | ICD-10-CM | POA: Diagnosis not present

## 2017-10-26 DIAGNOSIS — I10 Essential (primary) hypertension: Secondary | ICD-10-CM | POA: Diagnosis not present

## 2017-10-26 DIAGNOSIS — K210 Gastro-esophageal reflux disease with esophagitis: Secondary | ICD-10-CM | POA: Diagnosis not present

## 2017-12-07 DIAGNOSIS — I10 Essential (primary) hypertension: Secondary | ICD-10-CM | POA: Diagnosis not present

## 2017-12-07 DIAGNOSIS — E1165 Type 2 diabetes mellitus with hyperglycemia: Secondary | ICD-10-CM | POA: Diagnosis not present

## 2017-12-07 DIAGNOSIS — K219 Gastro-esophageal reflux disease without esophagitis: Secondary | ICD-10-CM | POA: Diagnosis not present

## 2017-12-07 DIAGNOSIS — M1990 Unspecified osteoarthritis, unspecified site: Secondary | ICD-10-CM | POA: Diagnosis not present

## 2017-12-11 ENCOUNTER — Other Ambulatory Visit (HOSPITAL_COMMUNITY): Payer: Self-pay | Admitting: Pulmonary Disease

## 2017-12-11 DIAGNOSIS — R51 Headache: Principal | ICD-10-CM

## 2017-12-11 DIAGNOSIS — Z853 Personal history of malignant neoplasm of breast: Secondary | ICD-10-CM

## 2017-12-11 DIAGNOSIS — R519 Headache, unspecified: Secondary | ICD-10-CM

## 2017-12-24 ENCOUNTER — Ambulatory Visit (HOSPITAL_COMMUNITY): Payer: PPO

## 2018-01-04 ENCOUNTER — Ambulatory Visit (HOSPITAL_COMMUNITY)
Admission: RE | Admit: 2018-01-04 | Discharge: 2018-01-04 | Disposition: A | Discharge status: Home / Self Care | Payer: PPO | Source: Ambulatory Visit | Attending: Pulmonary Disease | Admitting: Pulmonary Disease

## 2018-01-04 DIAGNOSIS — Z853 Personal history of malignant neoplasm of breast: Secondary | ICD-10-CM

## 2018-01-04 DIAGNOSIS — R51 Headache: Secondary | ICD-10-CM | POA: Insufficient documentation

## 2018-01-04 DIAGNOSIS — R519 Headache, unspecified: Secondary | ICD-10-CM

## 2018-01-04 LAB — POCT I-STAT CREATININE: CREATININE: 1 mg/dL (ref 0.44–1.00)

## 2018-01-04 MED ORDER — IOPAMIDOL (ISOVUE-300) INJECTION 61%
75.0000 mL | Freq: Once | INTRAVENOUS | Status: AC | PRN
  Administered 2018-01-04: 75 mL via INTRAVENOUS

## 2018-01-28 DIAGNOSIS — H5213 Myopia, bilateral: Secondary | ICD-10-CM | POA: Diagnosis not present

## 2018-01-28 DIAGNOSIS — H35371 Puckering of macula, right eye: Secondary | ICD-10-CM | POA: Diagnosis not present

## 2018-01-28 DIAGNOSIS — H524 Presbyopia: Secondary | ICD-10-CM | POA: Diagnosis not present

## 2018-01-28 DIAGNOSIS — H52223 Regular astigmatism, bilateral: Secondary | ICD-10-CM | POA: Diagnosis not present

## 2018-05-04 DIAGNOSIS — E669 Obesity, unspecified: Secondary | ICD-10-CM | POA: Diagnosis not present

## 2018-05-04 DIAGNOSIS — I10 Essential (primary) hypertension: Secondary | ICD-10-CM | POA: Diagnosis not present

## 2018-05-04 DIAGNOSIS — E1165 Type 2 diabetes mellitus with hyperglycemia: Secondary | ICD-10-CM | POA: Diagnosis not present

## 2018-05-10 DIAGNOSIS — E669 Obesity, unspecified: Secondary | ICD-10-CM | POA: Diagnosis not present

## 2018-05-10 DIAGNOSIS — I10 Essential (primary) hypertension: Secondary | ICD-10-CM | POA: Diagnosis not present

## 2018-05-10 DIAGNOSIS — E1165 Type 2 diabetes mellitus with hyperglycemia: Secondary | ICD-10-CM | POA: Diagnosis not present

## 2018-08-04 DIAGNOSIS — Z0000 Encounter for general adult medical examination without abnormal findings: Secondary | ICD-10-CM | POA: Diagnosis not present

## 2018-08-04 DIAGNOSIS — Z23 Encounter for immunization: Secondary | ICD-10-CM | POA: Diagnosis not present

## 2018-08-09 DIAGNOSIS — I10 Essential (primary) hypertension: Secondary | ICD-10-CM | POA: Diagnosis not present

## 2018-08-09 DIAGNOSIS — E119 Type 2 diabetes mellitus without complications: Secondary | ICD-10-CM | POA: Diagnosis not present

## 2018-08-09 DIAGNOSIS — E669 Obesity, unspecified: Secondary | ICD-10-CM | POA: Diagnosis not present

## 2018-08-09 DIAGNOSIS — K219 Gastro-esophageal reflux disease without esophagitis: Secondary | ICD-10-CM | POA: Diagnosis not present

## 2018-08-10 LAB — LIPID PANEL
ALBUMIN/GLOBULIN RATIO: 1.4
ALT: 49 — AB (ref 3–30)
AST: 40
Albumin: 4
Alkaline Phosphatase: 114
BUN: 21 (ref 4–21)
Calcium: 9.8
Carbon Dioxide, Total: 26
Chloride: 100
Creat: 0.93
EGFR (African American): 75
EGFR (Non-African Amer.): 65
Globulin: 2.8
Glucose: 281
Potassium: 4.5
Sodium: 135
Total Bilirubin: 0.6
Total Protein: 6.8 (ref 6.4–8.2)

## 2018-08-10 LAB — HEMOGLOBIN A1C: Hgb A1c MFr Bld: 10.7 — AB (ref 4.0–6.0)

## 2018-08-18 DIAGNOSIS — Z1211 Encounter for screening for malignant neoplasm of colon: Secondary | ICD-10-CM | POA: Diagnosis not present

## 2018-11-03 DIAGNOSIS — E1165 Type 2 diabetes mellitus with hyperglycemia: Secondary | ICD-10-CM | POA: Diagnosis not present

## 2018-11-03 DIAGNOSIS — K210 Gastro-esophageal reflux disease with esophagitis: Secondary | ICD-10-CM | POA: Diagnosis not present

## 2018-11-03 DIAGNOSIS — I10 Essential (primary) hypertension: Secondary | ICD-10-CM | POA: Diagnosis not present

## 2018-11-03 DIAGNOSIS — E785 Hyperlipidemia, unspecified: Secondary | ICD-10-CM | POA: Diagnosis not present

## 2019-05-18 ENCOUNTER — Other Ambulatory Visit (HOSPITAL_COMMUNITY): Payer: Self-pay | Admitting: Pulmonary Disease

## 2019-05-18 ENCOUNTER — Other Ambulatory Visit: Payer: Self-pay

## 2019-05-18 ENCOUNTER — Ambulatory Visit (HOSPITAL_COMMUNITY)
Admission: RE | Admit: 2019-05-18 | Discharge: 2019-05-18 | Disposition: A | Discharge status: Home / Self Care | Payer: PPO | Source: Ambulatory Visit | Attending: Pulmonary Disease | Admitting: Pulmonary Disease

## 2019-05-18 DIAGNOSIS — R0789 Other chest pain: Secondary | ICD-10-CM

## 2019-05-18 DIAGNOSIS — E1165 Type 2 diabetes mellitus with hyperglycemia: Secondary | ICD-10-CM | POA: Diagnosis not present

## 2019-05-18 DIAGNOSIS — M545 Low back pain, unspecified: Secondary | ICD-10-CM

## 2019-05-18 DIAGNOSIS — R079 Chest pain, unspecified: Secondary | ICD-10-CM | POA: Diagnosis not present

## 2019-05-18 DIAGNOSIS — I10 Essential (primary) hypertension: Secondary | ICD-10-CM | POA: Diagnosis not present

## 2019-05-18 DIAGNOSIS — M48061 Spinal stenosis, lumbar region without neurogenic claudication: Secondary | ICD-10-CM | POA: Diagnosis not present

## 2019-05-18 DIAGNOSIS — M4186 Other forms of scoliosis, lumbar region: Secondary | ICD-10-CM | POA: Diagnosis not present

## 2019-05-23 ENCOUNTER — Other Ambulatory Visit: Payer: Self-pay

## 2019-07-12 ENCOUNTER — Other Ambulatory Visit (HOSPITAL_COMMUNITY): Payer: Self-pay | Admitting: Pulmonary Disease

## 2019-07-12 ENCOUNTER — Other Ambulatory Visit: Payer: Self-pay | Admitting: Pulmonary Disease

## 2019-07-12 DIAGNOSIS — M545 Low back pain, unspecified: Secondary | ICD-10-CM

## 2019-07-18 ENCOUNTER — Ambulatory Visit (HOSPITAL_COMMUNITY)
Admission: RE | Admit: 2019-07-18 | Discharge: 2019-07-18 | Disposition: A | Discharge status: Home / Self Care | Payer: PPO | Source: Ambulatory Visit | Attending: Pulmonary Disease | Admitting: Pulmonary Disease

## 2019-07-18 ENCOUNTER — Other Ambulatory Visit: Payer: Self-pay

## 2019-07-18 DIAGNOSIS — M545 Low back pain, unspecified: Secondary | ICD-10-CM

## 2019-08-15 DIAGNOSIS — Z23 Encounter for immunization: Secondary | ICD-10-CM | POA: Diagnosis not present

## 2019-08-15 DIAGNOSIS — Z0000 Encounter for general adult medical examination without abnormal findings: Secondary | ICD-10-CM | POA: Diagnosis not present

## 2019-08-17 ENCOUNTER — Other Ambulatory Visit (HOSPITAL_COMMUNITY): Payer: Self-pay | Admitting: Pulmonary Disease

## 2019-08-17 DIAGNOSIS — M1990 Unspecified osteoarthritis, unspecified site: Secondary | ICD-10-CM

## 2019-08-22 ENCOUNTER — Encounter: Payer: Self-pay | Admitting: Family Medicine

## 2019-08-22 DIAGNOSIS — H52203 Unspecified astigmatism, bilateral: Secondary | ICD-10-CM | POA: Diagnosis not present

## 2019-08-22 DIAGNOSIS — H25813 Combined forms of age-related cataract, bilateral: Secondary | ICD-10-CM | POA: Diagnosis not present

## 2019-08-22 DIAGNOSIS — I10 Essential (primary) hypertension: Secondary | ICD-10-CM | POA: Diagnosis not present

## 2019-08-22 DIAGNOSIS — E669 Obesity, unspecified: Secondary | ICD-10-CM | POA: Diagnosis not present

## 2019-08-22 DIAGNOSIS — E119 Type 2 diabetes mellitus without complications: Secondary | ICD-10-CM | POA: Diagnosis not present

## 2019-08-22 DIAGNOSIS — K219 Gastro-esophageal reflux disease without esophagitis: Secondary | ICD-10-CM | POA: Diagnosis not present

## 2019-08-22 DIAGNOSIS — H5213 Myopia, bilateral: Secondary | ICD-10-CM | POA: Diagnosis not present

## 2019-08-22 DIAGNOSIS — H35371 Puckering of macula, right eye: Secondary | ICD-10-CM | POA: Diagnosis not present

## 2019-08-22 DIAGNOSIS — H524 Presbyopia: Secondary | ICD-10-CM | POA: Diagnosis not present

## 2019-08-23 LAB — COMPREHENSIVE METABOLIC PANEL
ALBUMIN/GLOBULIN RATIO: 1.4
ALT: 22 (ref 3–30)
AST: 22
Albumin: 4 (ref 3.5–5.0)
Alkaline Phosphatase: 125
BUN: 15 (ref 4–21)
Calcium: 9.5
Carbon Dioxide, Total: 23
Chloride: 105
Creat: 0.77
EGFR (African American): 94
EGFR (Non-African Amer.): 81
Globulin: 2.9
Glucose: 160
Potassium: 4.3
Sodium: 137
Total Bilirubin: 0.4
Total Protein: 6.9 (ref 6.4–8.2)

## 2019-08-23 LAB — CBC
BASO(ABSOLUTE): 48
Basophils: 0.7
Eosinophils Absolute: 269
Eosinophils, %: 3.9
HCT: 44 — AB (ref 29–41)
Hemoglobin: 14.5
Lymphocytes: 23.8
Lymphs Abs: 1642
MCH: 29.4
MCHC: 33.3
MCV: 88.2 (ref 76–111)
MPV: 10.6 fL (ref 7.5–11.5)
Monocytes(Absolute): 345
Monocytes: 5
Neutro Abs: 4595
Neutrophils: 66.6
RBC: 4.93 (ref 3.87–5.11)
RDW: 13.5
WBC: 6.9
platelet count: 354

## 2019-08-23 LAB — LIPID PANEL
Chol/HDL Ratio: 4.3
Cholesterol, Total: 189
HDL Cholesterol: 44 (ref 35–70)
LDL Cholesterol: 120
Non HDL Cholesterol: 145
Triglycerides: 141 (ref 40–160)

## 2019-08-23 LAB — HEMOGLOBIN A1C
ALBUMIN/GLOBULIN RATIO: 1.4
ALT: 22 (ref 3–30)
AST: 22
Albumin: 4
Alkaline Phosphatase: 125
BUN: 15 (ref 4–21)
Calcium: 9.5
Carbon Dioxide, Total: 23
Chloride: 105
Creat: 0.77
EGFR (African American): 94
EGFR (Non-African Amer.): 81
Globulin: 2.9
Glucose: 160
Hgb A1c MFr Bld: 6.2 — AB (ref 4.0–6.0)
Potassium: 4.3
Sodium: 137
Total Bilirubin: 0.4
Total Protein: 6.9 (ref 6.4–8.2)

## 2019-08-23 LAB — MICROALBUMIN / CREATININE URINE RATIO
Albumin ELP, Urine: 5.9
Albumin/Creatinine Ratio, Urine, POC: 28
Creatinine: 213

## 2019-09-07 DIAGNOSIS — H21562 Pupillary abnormality, left eye: Secondary | ICD-10-CM | POA: Diagnosis not present

## 2019-09-07 DIAGNOSIS — H2512 Age-related nuclear cataract, left eye: Secondary | ICD-10-CM | POA: Diagnosis not present

## 2019-09-07 DIAGNOSIS — H2511 Age-related nuclear cataract, right eye: Secondary | ICD-10-CM | POA: Diagnosis not present

## 2019-09-07 DIAGNOSIS — H25012 Cortical age-related cataract, left eye: Secondary | ICD-10-CM | POA: Diagnosis not present

## 2019-09-07 DIAGNOSIS — H25011 Cortical age-related cataract, right eye: Secondary | ICD-10-CM | POA: Diagnosis not present

## 2019-09-13 ENCOUNTER — Other Ambulatory Visit: Payer: Self-pay

## 2019-09-14 DIAGNOSIS — H2511 Age-related nuclear cataract, right eye: Secondary | ICD-10-CM | POA: Diagnosis not present

## 2019-09-14 DIAGNOSIS — H25011 Cortical age-related cataract, right eye: Secondary | ICD-10-CM | POA: Diagnosis not present

## 2019-11-07 ENCOUNTER — Encounter: Payer: Self-pay | Admitting: Family Medicine

## 2019-11-07 ENCOUNTER — Other Ambulatory Visit: Payer: Self-pay

## 2019-11-07 ENCOUNTER — Ambulatory Visit (INDEPENDENT_AMBULATORY_CARE_PROVIDER_SITE_OTHER): Payer: PPO | Admitting: Family Medicine

## 2019-11-07 VITALS — BP 128/80 | HR 74 | Temp 98.2°F | Ht 66.0 in | Wt 210.0 lb

## 2019-11-07 DIAGNOSIS — M19042 Primary osteoarthritis, left hand: Secondary | ICD-10-CM

## 2019-11-07 DIAGNOSIS — I10 Essential (primary) hypertension: Secondary | ICD-10-CM

## 2019-11-07 DIAGNOSIS — Z1231 Encounter for screening mammogram for malignant neoplasm of breast: Secondary | ICD-10-CM | POA: Insufficient documentation

## 2019-11-07 DIAGNOSIS — E1159 Type 2 diabetes mellitus with other circulatory complications: Secondary | ICD-10-CM

## 2019-11-07 DIAGNOSIS — E119 Type 2 diabetes mellitus without complications: Secondary | ICD-10-CM

## 2019-11-07 DIAGNOSIS — Z780 Asymptomatic menopausal state: Secondary | ICD-10-CM

## 2019-11-07 DIAGNOSIS — M19041 Primary osteoarthritis, right hand: Secondary | ICD-10-CM

## 2019-11-07 DIAGNOSIS — R197 Diarrhea, unspecified: Secondary | ICD-10-CM | POA: Diagnosis not present

## 2019-11-07 DIAGNOSIS — K921 Melena: Secondary | ICD-10-CM | POA: Diagnosis not present

## 2019-11-07 NOTE — Patient Instructions (Addendum)
Fasting labwork prior to next appt Concerning foot exam-call for referral to podiatry or appointment for diabetic shoe fitting

## 2019-11-07 NOTE — Progress Notes (Signed)
New Patient Office Visit  Subjective:  Patient ID: Lauren Sullivan, female    DOB: July 02, 1951  Age: 69 y.o. MRN: [id number]  CC:  Chief Complaint  Patient presents with  . Establish Care    HPI Lauren Sullivan presents for HTN/Hypertension  DM-ozempic started-11/19  Breast Cancer 17/19 chemo/radiation-94-tamoifen Peripheral neuropathy-norrtriptyline- 500mg  BID DDD-lumbar MRI 11/20-prednisone in the past for pain relief-injection 2-3 years ago-did not feel safe anymore with injections Pt using oral prednisone every few months arthritiis hands, back and feet Past Medical History:  Diagnosis Date  . Arthritis   . Blood transfusion without reported diagnosis   . Breast CA ([place])   . Cataract   . Diabetes mellitus without complication ([place])   . GERD (gastroesophageal reflux disease)   . Hypertension     Past Surgical History:  Procedure Laterality Date  . BREAST SURGERY    . EYE SURGERY    . TUBAL LIGATION      Family History  Problem Relation Age of Onset  . Cancer Mother   . Heart disease Mother   . Hypertension Mother   . Hypertension Father     Social History   Socioeconomic History  . Marital status: Married    Spouse name: Not on file  . Number of children: Not on file  . Years of education: Not on file  . Highest education level: Not on file  Occupational History  . Occupation: part time Network engineer  Tobacco Use  . Smoking status: Not on file  . Smokeless tobacco: Never Used  Substance and Sexual Activity  . Alcohol use: Never  . Drug use: Never  . Sexual activity: Yes  Other Topics Concern  . Not on file  Social History Narrative  . Not on file   Social Determinants of Health   Financial Resource Strain:   . Difficulty of Paying Living Expenses: Not on file  Food Insecurity:   . Worried About Charity fundraiser in the Last Year: Not on file  . Ran Out of Food in the Last Year: Not on file  Transportation Needs:   . Lack of Transportation  (Medical): Not on file  . Lack of Transportation (Non-Medical): Not on file  Physical Activity:   . Days of Exercise per Week: Not on file  . Minutes of Exercise per Session: Not on file  Stress:   . Feeling of Stress : Not on file  Social Connections:   . Frequency of Communication with Friends and Family: Not on file  . Frequency of Social Gatherings with Friends and Family: Not on file  . Attends Religious Services: Not on file  . Active Member of Clubs or Organizations: Not on file  . Attends Archivist Meetings: Not on file  . Marital Status: Not on file  Intimate Partner Violence:   . Fear of Current or Ex-Partner: Not on file  . Emotionally Abused: Not on file  . Physically Abused: Not on file  . Sexually Abused: Not on file    ROS Review of Systems  Constitutional: Negative.   HENT: Negative.   Eyes: Negative.   Respiratory: Positive for cough and shortness of breath.   Gastrointestinal: Positive for blood in stool and diarrhea.       GERD  Endocrine:       DM  Musculoskeletal: Positive for back pain and myalgias.  Skin: Negative.   Allergic/Immunologic: Negative.   Neurological: Positive for headaches.  Hematological: Bruises/bleeds easily.  Psychiatric/Behavioral: Negative.     Objective:   Today's Vitals: BP 128/80 (BP Location: Left Arm, Patient Position: Sitting, Cuff Size: Normal)   Pulse 74   Temp 98.2 F (36.8 C) (Temporal)   Ht 5\' 6"  (1.676 m)   Wt 210 lb (95.3 kg)   SpO2 98%   BMI 33.89 kg/m   Physical Exam Constitutional:      Appearance: Normal appearance.  HENT:     Head: Normocephalic and atraumatic.  Eyes:     Conjunctiva/sclera: Conjunctivae normal.  Cardiovascular:     Rate and Rhythm: Normal rate and regular rhythm.     Pulses: Normal pulses.     Heart sounds: Normal heart sounds.  Musculoskeletal:     Cervical back: Normal range of motion and neck supple.  Neurological:     Mental Status: She is alert and oriented  to person, place, and time.     Assessment & Plan:  1. Encounter for screening mammogram for malignant neoplasm of breast H/o of breast cancer - DG Bone Density; Future - MM Digital Screening; Future 2. Menopause - DG Bone Density; Future 3. Hypertension complicating diabetes ([place]) Amlodipine/atenolol-stable - COMPLETE METABOLIC PANEL WITH GFR Reviewed renal function 4. Diarrhea, unspecified type - Ambulatory referral to Gastroenterology - COMPLETE METABOLIC PANEL WITH GFR - CBC with Differential Concern for blood in stool and diarrhea omeprazole 5. Hematochezia - Ambulatory referral to Gastroenterology - COMPLETE METABOLIC PANEL WITH GFR - CBC with Differential  6. Arthritis of both hands Naprosyn BID  7. Controlled type 2 diabetes mellitus without complication, without long-term current use of insulin (HCC) Foot exam - Hemoglobin A1c ozempic 1mg  Once touch delica Lancets/one touch ultra test strips Nortriptyline for peripheral neuropathy Outpatient Encounter Medications as of 11/07/2019  Medication Sig  . amLODipine (NORVASC) 10 MG tablet Take 10 mg by mouth daily.  . metoprolol tartrate (LOPRESSOR) 100 MG tablet Take 100 mg by mouth 2 (two) times daily.  . naproxen (NAPROSYN) 500 MG tablet Take 500 mg by mouth 2 (two) times daily with a meal.  . nortriptyline (PAMELOR) 10 MG/5ML solution Take 500 mg by mouth 2 (two) times daily.  Marland Kitchen omeprazole (PRILOSEC) 20 MG capsule Take 20 mg by mouth daily.  . Semaglutide (OZEMPIC, 1 MG/DOSE, ) Inject 1 mg into the skin once a week.   No facility-administered encounter medications on file as of 11/07/2019.    Follow-up: 4 months  Axxel Gude Hannah Beat, MD

## 2019-11-08 ENCOUNTER — Encounter: Payer: Self-pay | Admitting: Gastroenterology

## 2019-11-21 ENCOUNTER — Encounter: Payer: Self-pay | Admitting: Gastroenterology

## 2019-11-21 ENCOUNTER — Telehealth: Payer: Self-pay | Admitting: Gastroenterology

## 2019-11-21 ENCOUNTER — Other Ambulatory Visit: Payer: Self-pay

## 2019-11-21 ENCOUNTER — Ambulatory Visit: Payer: PPO | Admitting: Gastroenterology

## 2019-11-21 VITALS — BP 133/81 | HR 87 | Temp 96.9°F | Ht 65.0 in | Wt 210.8 lb

## 2019-11-21 DIAGNOSIS — R197 Diarrhea, unspecified: Secondary | ICD-10-CM

## 2019-11-21 DIAGNOSIS — R109 Unspecified abdominal pain: Secondary | ICD-10-CM | POA: Diagnosis not present

## 2019-11-21 DIAGNOSIS — K625 Hemorrhage of anus and rectum: Secondary | ICD-10-CM | POA: Diagnosis not present

## 2019-11-21 MED ORDER — PEG 3350-KCL-NA BICARB-NACL 420 G PO SOLR: 4000.0000 mL | ORAL | 0 refills | Status: DC

## 2019-11-21 MED ORDER — DICYCLOMINE HCL 10 MG PO CAPS: ORAL_CAPSULE | ORAL | 1 refills | Status: DC

## 2019-11-21 NOTE — Patient Instructions (Signed)
1. Start Bentyl 10mg  up to three times daily to treat/prevent abdominal pain and loose stools. Take preferably before a meal. Hold for constipation.  2. Colonoscopy as scheduled. See separate instructions.

## 2019-11-21 NOTE — Progress Notes (Signed)
Primary Care Physician:  Maryruth Hancock, MD  Primary Gastroenterologist:  Garfield Cornea, MD   Chief Complaint  Patient presents with  . Diarrhea    left sided pain,uncontrollable diarrhea    HPI:  Lauren Sullivan is a 69 y.o. female here at the request of Dr. Holly Bodily for further evaluation of diarrhea left-sided pain, and blood in the stool. We saw patient in 2008 for chronic diarrhea.  Colonoscopy August 2008 for chronic diarrhea and rectal bleeding showed minimally friable anal canal.  Questionable boggy edematous terminal ileum.  Biopsies from the terminal ileum were benign.  Random colon biopsies benign.  No evidence of microscopic colitis or inflammatory bowel disease.  States her diarrhea never went away.  She has had it for over 2 decades.  She had diarrhea prior to cholecystectomy years ago.  Recently symptoms have been worse.  She is having increased urgency associated with fecal incontinence.  This is affecting her day-to-day life.  She has had a few episodes of nocturnal diarrhea as well.  She may be all right for couple of days, generally having just a few stools, tend to start out solid and loose to watery.  And she will have days where she has left-sided abdominal pain associated with significant urgency and fecal incontinence with several episodes of watery stool.  Her uncontrollable symptoms occur at least once every 1 to 2 weeks.  She notes the left-sided pain in her abdomen feels achy, generally does go away after bowel movement.  Some days she has no pain.  She does have postprandial urgency, does not matter what she eats.  This is a daily symptom.  Occasional bright red blood per rectum, small amount.  No melena.  Notes more significant diarrhea postprandially with certain foods such as spicy foods since her gallbladder was removed.  She has some nausea but no vomiting.  Denies any recent antibiotic use.  She has had a 30-day course of prednisone off and on for her back and arthritis.   Generally does not have any improvement in her diarrhea on prednisone.  Started ozempic for 18 months. Lost 25-30 pounds.   For diarrhea she tried probiotics without improvement.  She recalls having an upper endoscopy at time of her colonoscopy in 2008.  I have not been able to track down the report of continue to look.  She states that we specifically checked her for celiac at that time and again I do not see any supporting records.  We will look into this and if we cannot prove that we checked her for celiac disease, we will obtain serologies in the near future.   Current Outpatient Medications  Medication Sig Dispense Refill  . amLODipine (NORVASC) 10 MG tablet Take 10 mg by mouth daily.    . metoprolol tartrate (LOPRESSOR) 100 MG tablet Take 100 mg by mouth 2 (two) times daily.    . naproxen (NAPROSYN) 500 MG tablet Take 500 mg by mouth 2 (two) times daily with a meal.    . nortriptyline (PAMELOR) 50 MG capsule Take 50 mg by mouth at bedtime.    Marland Kitchen omeprazole (PRILOSEC) 20 MG capsule Take 20 mg by mouth daily.    . Semaglutide (OZEMPIC, 1 MG/DOSE, [place]) Inject 1 mg into the skin once a week.    .        No current facility-administered medications for this visit.    Allergies as of 11/21/2019 - Review Complete 11/21/2019  Allergen Reaction Noted  . Vancomycin  Itching and Rash 01/27/2011    Past Medical History:  Diagnosis Date  . Arthritis   . Blood transfusion without reported diagnosis   . Breast CA ([place])   . Cataract   . Diabetes mellitus without complication ([place])   . GERD (gastroesophageal reflux disease)   . Hypertension     Past Surgical History:  Procedure Laterality Date  . BREAST SURGERY    . CHOLECYSTECTOMY    . COLONOSCOPY  2008   Dr. Gala Romney: Minimally friable anal canal, patient only boggy edematous terminal ileum.  Biopsies from the terminal ileum were benign.  Random colon biopsies benign.  No evidence of microscopic colitis or inflammatory bowel disease.  Marland Kitchen  EYE SURGERY    . TUBAL LIGATION      Family History  Problem Relation Age of Onset  . Cancer Mother        pancreatic  . Heart disease Mother   . Hypertension Mother   . Hypertension Father   . Colon cancer Neg Hx     Social History   Socioeconomic History  . Marital status: Married    Spouse name: Not on file  . Number of children: Not on file  . Years of education: Not on file  . Highest education level: Not on file  Occupational History  . Occupation: part time Network engineer  Tobacco Use  . Smoking status: Former Research scientist (life sciences)  . Smokeless tobacco: Never Used  Substance and Sexual Activity  . Alcohol use: Never  . Drug use: Never  . Sexual activity: Yes  Other Topics Concern  . Not on file  Social History Narrative  . Not on file   Social Determinants of Health   Financial Resource Strain:   . Difficulty of Paying Living Expenses: Not on file  Food Insecurity:   . Worried About Charity fundraiser in the Last Year: Not on file  . Ran Out of Food in the Last Year: Not on file  Transportation Needs:   . Lack of Transportation (Medical): Not on file  . Lack of Transportation (Non-Medical): Not on file  Physical Activity:   . Days of Exercise per Week: Not on file  . Minutes of Exercise per Session: Not on file  Stress:   . Feeling of Stress : Not on file  Social Connections:   . Frequency of Communication with Friends and Family: Not on file  . Frequency of Social Gatherings with Friends and Family: Not on file  . Attends Religious Services: Not on file  . Active Member of Clubs or Organizations: Not on file  . Attends Archivist Meetings: Not on file  . Marital Status: Not on file  Intimate Partner Violence:   . Fear of Current or Ex-Partner: Not on file  . Emotionally Abused: Not on file  . Physically Abused: Not on file  . Sexually Abused: Not on file      ROS:  General: Negative for anorexia, unintentional weight loss, fever, chills, fatigue,  weakness. Eyes: Negative for vision changes.  ENT: Negative for hoarseness, difficulty swallowing , nasal congestion. CV: Negative for chest pain, angina, palpitations, dyspnea on exertion, peripheral edema.  Respiratory: Negative for dyspnea at rest, dyspnea on exertion, cough, sputum, wheezing.  GI: See history of present illness. GU:  Negative for dysuria, hematuria, urinary incontinence, urinary frequency, nocturnal urination.  [id number] for joint pain, low back pain.  Derm: Negative for rash or itching.  Neuro: Negative for weakness, abnormal sensation, seizure, frequent headaches,  memory loss, confusion.  Psych: Negative for anxiety, depression, suicidal ideation, hallucinations.  Endo: Negative for unusual weight change.  Heme: Negative for bruising or bleeding. Allergy: Negative for rash or hives.    Physical Examination:  BP 133/81   Pulse 87   Temp (!) 96.9 F (36.1 C)   Ht 5\' 5"  (1.651 m)   Wt 210 lb 12.8 oz (95.6 kg)   BMI 35.08 kg/m    General: Well-nourished, well-developed in no acute distress.  Head: Normocephalic, atraumatic.   Eyes: Conjunctiva pink, no icterus. Mouth: Oropharyngeal mucosa moist and pink , no lesions erythema or exudate. Neck: Supple without thyromegaly, masses, or lymphadenopathy.  Lungs: Clear to auscultation bilaterally.  Heart: Regular rate and rhythm, no murmurs rubs or gallops.  Abdomen: Bowel sounds are normal, nontender, nondistended, no hepatosplenomegaly or masses, no abdominal bruits or    hernia , no rebound or guarding.   Rectal: not performed Extremities: No lower extremity edema. No clubbing or deformities.  Neuro: Alert and oriented x 4 , grossly normal neurologically.  Skin: Warm and dry, no rash or jaundice.   Psych: Alert and cooperative, normal mood and affect.  Labs: Lab Results  Component Value Date   CREATININE 0.77 08/22/2019   CREATININE 0.77 08/22/2019   BUN 15 08/22/2019   BUN 15 08/22/2019   NA 137  08/22/2019   NA 137 08/22/2019   K 4.3 08/22/2019   K 4.3 08/22/2019   CL 105 08/22/2019   CL 105 08/22/2019   CO2 23 08/22/2019   CO2 23 08/22/2019   Lab Results  Component Value Date   WBC 6.9 08/22/2019   HGB 14.5 08/22/2019   HCT 44 (A) 08/22/2019   MCV 88.2 08/22/2019   PLT 354 08/22/2019   Lab Results  Component Value Date   HGBA1C 6.2 (A) 08/22/2019     Imaging Studies: No results found.

## 2019-11-21 NOTE — Assessment & Plan Note (Signed)
69 year old female presenting with acute on chronic diarrhea, left-sided abdominal pain, rectal bleeding.  She reports diarrhea for decades even prior to cholecystectomy which was also removed.  Colonoscopy in 2008 unremarkable, no evidence of microscopic colitis or IBD.  There is mention of celiac serologies being done, patient states that she was advised that she did not have celiac disease and even states that she had an upper endoscopy, colonoscopy but there are no records indicating this.  Question records did not get linked when transition to epic previously.  Recently she has had worsening diarrhea, left-sided abdominal pain which improves after bowel movement, episodes of urgency incontinence which are affecting her lifestyle.  She may have underlying IBS but with recent change in symptoms and rectal bleeding would recommend colonoscopy.  Previously did not do well with conscious sedation so her last colonoscopy was done with propofol.  We will do the same at this time.  I have discussed the risks, alternatives, benefits with regards to but not limited to the risk of reaction to medication, bleeding, infection, perforation and the patient is agreeable to proceed. Written consent to be obtained.  Trial of dicyclomine 10 mg up to 3 times daily for abdominal pain and diarrhea.  We will asked patient to get for celiac serologies given we cannot locate any records ruling this out.

## 2019-11-21 NOTE — Telephone Encounter (Signed)
Please let patient know that I looked everywhere that I could and cannot find a copy of upper endoscopy report or celiac serologies.  It would be most helpful if she would go for celiac serologies given her chronic diarrhea.  I have sent orders to [facility].

## 2019-11-21 NOTE — Telephone Encounter (Signed)
PATIENT STATED HER BENTYL IS NOT AT [facility], PLEASE RESEND

## 2019-11-21 NOTE — Telephone Encounter (Signed)
Spoke with pt. Pt will have labs completed. Pt also stated that CP didn't have her Dicyclomine RX LSL sent in for pt today. Spoke with CP and RX is ready for pickup. Pt is aware.

## 2019-11-21 NOTE — Telephone Encounter (Signed)
Called [facility], pts medication is ready for pickup according to [facility]. I spoke with Caryl Pina.

## 2019-11-22 NOTE — Progress Notes (Signed)
CC'ED TO PCP 

## 2019-11-23 ENCOUNTER — Ambulatory Visit (HOSPITAL_COMMUNITY)
Admission: RE | Admit: 2019-11-23 | Discharge: 2019-11-23 | Disposition: A | Discharge status: Home / Self Care | Payer: PPO | Source: Ambulatory Visit | Attending: Family Medicine | Admitting: Family Medicine

## 2019-11-23 ENCOUNTER — Other Ambulatory Visit: Payer: Self-pay

## 2019-11-23 DIAGNOSIS — Z780 Asymptomatic menopausal state: Secondary | ICD-10-CM

## 2019-11-23 DIAGNOSIS — Z1231 Encounter for screening mammogram for malignant neoplasm of breast: Secondary | ICD-10-CM | POA: Insufficient documentation

## 2019-11-23 DIAGNOSIS — R197 Diarrhea, unspecified: Secondary | ICD-10-CM | POA: Diagnosis not present

## 2019-11-23 DIAGNOSIS — Z13820 Encounter for screening for osteoporosis: Secondary | ICD-10-CM | POA: Diagnosis not present

## 2019-11-24 LAB — IGA: Immunoglobulin A: 347 mg/dL — ABNORMAL HIGH (ref 70–320)

## 2019-11-24 LAB — TISSUE TRANSGLUTAMINASE, IGA: (tTG) Ab, IgA: 1 U/mL

## 2019-12-19 ENCOUNTER — Other Ambulatory Visit: Payer: Self-pay | Admitting: Family Medicine

## 2019-12-29 ENCOUNTER — Other Ambulatory Visit: Payer: Self-pay

## 2019-12-29 ENCOUNTER — Encounter (HOSPITAL_COMMUNITY): Payer: Self-pay

## 2019-12-30 ENCOUNTER — Other Ambulatory Visit (HOSPITAL_COMMUNITY)
Admission: RE | Admit: 2019-12-30 | Discharge: 2019-12-30 | Disposition: A | Discharge status: Home / Self Care | Payer: PPO | Source: Ambulatory Visit | Attending: Internal Medicine | Admitting: Internal Medicine

## 2019-12-30 ENCOUNTER — Encounter (HOSPITAL_COMMUNITY)
Admission: RE | Admit: 2019-12-30 | Discharge: 2019-12-30 | Disposition: A | Discharge status: Home / Self Care | Payer: PPO | Source: Ambulatory Visit | Attending: Internal Medicine | Admitting: Internal Medicine

## 2019-12-30 DIAGNOSIS — Z20822 Contact with and (suspected) exposure to covid-19: Secondary | ICD-10-CM | POA: Insufficient documentation

## 2019-12-30 DIAGNOSIS — Z01812 Encounter for preprocedural laboratory examination: Secondary | ICD-10-CM | POA: Diagnosis not present

## 2019-12-31 LAB — SARS CORONAVIRUS 2 (TAT 6-24 HRS): SARS Coronavirus 2: NEGATIVE

## 2020-01-02 ENCOUNTER — Ambulatory Visit (HOSPITAL_COMMUNITY): Payer: PPO | Admitting: Anesthesiology

## 2020-01-02 ENCOUNTER — Encounter (HOSPITAL_COMMUNITY): Payer: Self-pay | Admitting: Internal Medicine

## 2020-01-02 ENCOUNTER — Ambulatory Visit (HOSPITAL_COMMUNITY)
Admission: RE | Admit: 2020-01-02 | Discharge: 2020-01-02 | Disposition: A | Discharge status: Home / Self Care | Payer: PPO | Attending: Internal Medicine | Admitting: Internal Medicine

## 2020-01-02 ENCOUNTER — Encounter (HOSPITAL_COMMUNITY)
Admission: RE | Disposition: A | Discharge status: Home / Self Care | Payer: Self-pay | Source: Home / Self Care | Attending: Internal Medicine

## 2020-01-02 DIAGNOSIS — H269 Unspecified cataract: Secondary | ICD-10-CM | POA: Insufficient documentation

## 2020-01-02 DIAGNOSIS — R197 Diarrhea, unspecified: Secondary | ICD-10-CM | POA: Diagnosis not present

## 2020-01-02 DIAGNOSIS — K635 Polyp of colon: Secondary | ICD-10-CM

## 2020-01-02 DIAGNOSIS — Z853 Personal history of malignant neoplasm of breast: Secondary | ICD-10-CM | POA: Insufficient documentation

## 2020-01-02 DIAGNOSIS — D127 Benign neoplasm of rectosigmoid junction: Secondary | ICD-10-CM | POA: Diagnosis not present

## 2020-01-02 DIAGNOSIS — I10 Essential (primary) hypertension: Secondary | ICD-10-CM | POA: Insufficient documentation

## 2020-01-02 DIAGNOSIS — Z881 Allergy status to other antibiotic agents status: Secondary | ICD-10-CM | POA: Insufficient documentation

## 2020-01-02 DIAGNOSIS — K5730 Diverticulosis of large intestine without perforation or abscess without bleeding: Secondary | ICD-10-CM | POA: Insufficient documentation

## 2020-01-02 DIAGNOSIS — Z87891 Personal history of nicotine dependence: Secondary | ICD-10-CM | POA: Insufficient documentation

## 2020-01-02 DIAGNOSIS — K529 Noninfective gastroenteritis and colitis, unspecified: Secondary | ICD-10-CM | POA: Insufficient documentation

## 2020-01-02 DIAGNOSIS — Z791 Long term (current) use of non-steroidal anti-inflammatories (NSAID): Secondary | ICD-10-CM | POA: Diagnosis not present

## 2020-01-02 DIAGNOSIS — K219 Gastro-esophageal reflux disease without esophagitis: Secondary | ICD-10-CM | POA: Diagnosis not present

## 2020-01-02 DIAGNOSIS — D120 Benign neoplasm of cecum: Secondary | ICD-10-CM | POA: Insufficient documentation

## 2020-01-02 DIAGNOSIS — M1990 Unspecified osteoarthritis, unspecified site: Secondary | ICD-10-CM | POA: Diagnosis not present

## 2020-01-02 DIAGNOSIS — E1136 Type 2 diabetes mellitus with diabetic cataract: Secondary | ICD-10-CM | POA: Insufficient documentation

## 2020-01-02 DIAGNOSIS — Z79899 Other long term (current) drug therapy: Secondary | ICD-10-CM | POA: Diagnosis not present

## 2020-01-02 HISTORY — PX: BIOPSY: SHX5522

## 2020-01-02 HISTORY — PX: POLYPECTOMY: SHX5525

## 2020-01-02 HISTORY — PX: COLONOSCOPY WITH PROPOFOL: SHX5780

## 2020-01-02 LAB — GLUCOSE, CAPILLARY
Glucose-Capillary: 83 mg/dL (ref 70–99)
Glucose-Capillary: 81 mg/dL (ref 70–99)

## 2020-01-02 SURGERY — COLONOSCOPY WITH PROPOFOL
Anesthesia: General

## 2020-01-02 MED ORDER — LACTATED RINGERS IV SOLN: INTRAVENOUS | Status: DC | PRN

## 2020-01-02 MED ORDER — FENTANYL CITRATE (PF) 100 MCG/2ML IJ SOLN
INTRAMUSCULAR | Status: DC | PRN
  Administered 2020-01-02 (×4): 25 ug via INTRAVENOUS

## 2020-01-02 MED ORDER — CHLORHEXIDINE GLUCONATE CLOTH 2 % EX PADS: 6.0000 | MEDICATED_PAD | Freq: Once | CUTANEOUS | Status: DC

## 2020-01-02 MED ORDER — LACTATED RINGERS IV SOLN: Freq: Once | INTRAVENOUS | Status: AC

## 2020-01-02 MED ORDER — GLYCOPYRROLATE PF 0.2 MG/ML IJ SOSY
PREFILLED_SYRINGE | INTRAMUSCULAR | Status: AC
  Filled 2020-01-02: qty 1

## 2020-01-02 MED ORDER — FENTANYL CITRATE (PF) 100 MCG/2ML IJ SOLN
INTRAMUSCULAR | Status: AC
  Filled 2020-01-02: qty 2

## 2020-01-02 MED ORDER — PROPOFOL 10 MG/ML IV BOLUS
INTRAVENOUS | Status: DC | PRN
  Administered 2020-01-02: 100 ug via INTRAVENOUS

## 2020-01-02 MED ORDER — PROPOFOL 500 MG/50ML IV EMUL
INTRAVENOUS | Status: DC | PRN
  Administered 2020-01-02: 200 ug/kg/min via INTRAVENOUS

## 2020-01-02 MED ORDER — LIDOCAINE HCL (CARDIAC) PF 100 MG/5ML IV SOSY
PREFILLED_SYRINGE | INTRAVENOUS | Status: DC | PRN
  Administered 2020-01-02: 40 mg via INTRATRACHEAL

## 2020-01-02 MED ORDER — PHENYLEPHRINE 40 MCG/ML (10ML) SYRINGE FOR IV PUSH (FOR BLOOD PRESSURE SUPPORT)
PREFILLED_SYRINGE | INTRAVENOUS | Status: AC
  Filled 2020-01-02: qty 10

## 2020-01-02 NOTE — Discharge Instructions (Signed)
Colonoscopy Discharge Instructions  Read the instructions outlined below and refer to this sheet in the next few weeks. These discharge instructions provide you with general information on caring for yourself after you leave the hospital. Your doctor may also give you specific instructions. While your treatment has been planned according to the most current medical practices available, unavoidable complications occasionally occur. If you have any problems or questions after discharge, call Dr. Gala Romney at [phone]. ACTIVITY  You may resume your regular activity, but move at a slower pace for the next 24 hours.   Take frequent rest periods for the next 24 hours.   Walking will help get rid of the air and reduce the bloated feeling in your belly (abdomen).   No driving for 24 hours (because of the medicine (anesthesia) used during the test).    Do not sign any important legal documents or operate any machinery for 24 hours (because of the anesthesia used during the test).  NUTRITION  Drink plenty of fluids.   You may resume your normal diet as instructed by your doctor.   Begin with a light meal and progress to your normal diet. Heavy or fried foods are harder to digest and may make you feel sick to your stomach (nauseated).   Avoid alcoholic beverages for 24 hours or as instructed.  MEDICATIONS  You may resume your normal medications unless your doctor tells you otherwise.  WHAT YOU CAN EXPECT TODAY  Some feelings of bloating in the abdomen.   Passage of more gas than usual.   Spotting of blood in your stool or on the toilet paper.  IF YOU HAD POLYPS REMOVED DURING THE COLONOSCOPY:  No aspirin products for 7 days or as instructed.   No alcohol for 7 days or as instructed.   Eat a soft diet for the next 24 hours.  FINDING OUT THE RESULTS OF YOUR TEST Not all test results are available during your visit. If your test results are not back during the visit, make an appointment  with your caregiver to find out the results. Do not assume everything is normal if you have not heard from your caregiver or the medical facility. It is important for you to follow up on all of your test results.  SEEK IMMEDIATE MEDICAL ATTENTION IF:  You have more than a spotting of blood in your stool.   Your belly is swollen (abdominal distention).   You are nauseated or vomiting.   You have a temperature over 101.   You have abdominal pain or discomfort that is severe or gets worse throughout the day.   Diverticulosis  Diverticulosis is a condition that develops when small pouches (diverticula) form in the wall of the large intestine (colon). The colon is where water is absorbed and stool (feces) is formed. The pouches form when the inside layer of the colon pushes through weak spots in the outer layers of the colon. You may have a few pouches or many of them. The pouches usually do not cause problems unless they become inflamed or infected. When this happens, the condition is called diverticulitis. What are the causes? The cause of this condition is not known. What increases the risk? The following factors may make you more likely to develop this condition:  Being older than age 47. Your risk for this condition increases with age. Diverticulosis is rare among people younger than age 29. By age 61, many people have it.  Eating a low-fiber diet.  Having frequent  constipation.  Being overweight.  Not getting enough exercise.  Smoking.  Taking over-the-counter pain medicines, like aspirin and ibuprofen.  Having a family history of diverticulosis. What are the signs or symptoms? In most people, there are no symptoms of this condition. If you do have symptoms, they may include:  Bloating.  Cramps in the abdomen.  Constipation or diarrhea.  Pain in the lower left side of the abdomen. How is this diagnosed? Because diverticulosis usually has no symptoms, it is most often  diagnosed during an exam for other colon problems. The condition may be diagnosed by:  Using a flexible scope to examine the colon (colonoscopy).  Taking an X-ray of the colon after dye has been put into the colon (barium enema).  Having a CT scan. How is this treated? You may not need treatment for this condition. Your health care provider may recommend treatment to prevent problems. You may need treatment if you have symptoms or if you previously had diverticulitis. Treatment may include:  Eating a high-fiber diet.  Taking a fiber supplement.  Taking a live bacteria supplement (probiotic).  Taking medicine to relax your colon. Follow these instructions at home: Medicines  Take over-the-counter and prescription medicines only as told by your health care provider.  If told by your health care provider, take a fiber supplement or probiotic. Constipation prevention Your condition may cause constipation. To prevent or treat constipation, you may need to:  Drink enough fluid to keep your urine pale yellow.  Take over-the-counter or prescription medicines.  Eat foods that are high in fiber, such as beans, whole grains, and fresh fruits and vegetables.  Limit foods that are high in fat and processed sugars, such as fried or sweet foods.  General instructions  Try not to strain when you have a bowel movement.  Keep all follow-up visits as told by your health care provider. This is important. Contact a health care provider if you:  Have pain in your abdomen.  Have bloating.  Have cramps.  Have not had a bowel movement in 3 days. Get help right away if:  Your pain gets worse.  Your bloating becomes very bad.  You have a fever or chills, and your symptoms suddenly get worse.  You vomit.  You have bowel movements that are bloody or black.  You have bleeding from your rectum. Summary  Diverticulosis is a condition that develops when small pouches (diverticula) form  in the wall of the large intestine (colon).  You may have a few pouches or many of them.  This condition is most often diagnosed during an exam for other colon problems.  Treatment may include increasing the fiber in your diet, taking supplements, or taking medicines. This information is not intended to replace advice given to you by your health care provider. Make sure you discuss any questions you have with your health care provider. Document Revised: 05/05/2019 Document Reviewed: 05/05/2019 Elsevier Patient Education  [place].  Colon Polyps  Polyps are tissue growths inside the body. Polyps can grow in many places, including the large intestine (colon). A polyp may be a round bump or a mushroom-shaped growth. You could have one polyp or several. Most colon polyps are noncancerous (benign). However, some colon polyps can become cancerous over time. Finding and removing the polyps early can help prevent this. What are the causes? The exact cause of colon polyps is not known. What increases the risk? You are more likely to develop this condition if you:  Have a family history of colon cancer or colon polyps.  Are older than 23 or older than 45 if you are African American.  Have inflammatory bowel disease, such as ulcerative colitis or Crohn's disease.  Have certain hereditary conditions, such as: ? Familial adenomatous polyposis. ? Lynch syndrome. ? Turcot syndrome. ? Peutz-Jeghers syndrome.  Are overweight.  Smoke cigarettes.  Do not get enough exercise.  Drink too much alcohol.  Eat a diet that is high in fat and red meat and low in fiber.  Had childhood cancer that was treated with abdominal radiation. What are the signs or symptoms? Most polyps do not cause symptoms. If you have symptoms, they may include:  Blood coming from your rectum when having a bowel movement.  Blood in your stool. The stool may look dark red or black.  Abdominal pain.  A  change in bowel habits, such as constipation or diarrhea. How is this diagnosed? This condition is diagnosed with a colonoscopy. This is a procedure in which a lighted, flexible scope is inserted into the anus and then passed into the colon to examine the area. Polyps are sometimes found when a colonoscopy is done as part of routine cancer screening tests. How is this treated? Treatment for this condition involves removing any polyps that are found. Most polyps can be removed during a colonoscopy. Those polyps will then be tested for cancer. Additional treatment may be needed depending on the results of testing. Follow these instructions at home: Lifestyle  Maintain a healthy weight, or lose weight if recommended by your health care provider.  Exercise every day or as told by your health care provider.  Do not use any products that contain nicotine or tobacco, such as cigarettes and e-cigarettes. If you need help quitting, ask your health care provider.  If you drink alcohol, limit how much you have: ? 0-1 drink a day for women. ? 0-2 drinks a day for men.  Be aware of how much alcohol is in your drink. In the [place] one drink equals one 12 oz bottle of beer (355 mL), one 5 oz glass of wine (148 mL), or one 1 oz shot of hard liquor (44 mL). Eating and drinking   Eat foods that are high in fiber, such as fruits, vegetables, and whole grains.  Eat foods that are high in calcium and vitamin D, such as milk, cheese, yogurt, eggs, liver, fish, and broccoli.  Limit foods that are high in fat, such as fried foods and desserts.  Limit the amount of red meat and processed meat you eat, such as hot dogs, sausage, bacon, and lunch meats. General instructions  Keep all follow-up visits as told by your health care provider. This is important. ? This includes having regularly scheduled colonoscopies. ? Talk to your health care provider about when you need a colonoscopy. Contact a health care  provider if:  You have new or worsening bleeding during a bowel movement.  You have new or increased blood in your stool.  You have a change in bowel habits.  You lose weight for no known reason. Summary  Polyps are tissue growths inside the body. Polyps can grow in many places, including the colon.  Most colon polyps are noncancerous (benign), but some can become cancerous over time.  This condition is diagnosed with a colonoscopy.  Treatment for this condition involves removing any polyps that are found. Most polyps can be removed during a colonoscopy. This information is not intended to replace  advice given to you by your health care provider. Make sure you discuss any questions you have with your health care provider. Document Revised: 01/21/2018 Document Reviewed: 01/21/2018 Elsevier Patient Education  [place].   Colon polyp and diverticulosis information provided  Further recommendations to follow pending review of pathology report  At patient request, I called Gearldine Bienenstock at [phone] and left message with results on voicemail

## 2020-01-02 NOTE — Transfer of Care (Signed)
Immediate Anesthesia Transfer of Care Note  Patient: Lauren Sullivan  Procedure(s) Performed: COLONOSCOPY WITH PROPOFOL (N/A ) POLYPECTOMY BIOPSY  Patient Location: PACU  Anesthesia Type:General  Level of Consciousness: awake, alert  and oriented  Airway & Oxygen Therapy: Patient Spontanous Breathing  Post-op Assessment: Report given to RN, Post -op Vital signs reviewed and stable and Patient moving all extremities X 4  Post vital signs: Reviewed and stable  Last Vitals:  Vitals Value Taken Time  BP    Temp    Pulse    Resp    SpO2      Last Pain:  Vitals:   01/02/20 1410  TempSrc:   PainSc: 0-No pain      Patients Stated Pain Goal: 8 ([id number])  Complications: No apparent anesthesia complications

## 2020-01-02 NOTE — Op Note (Signed)
[facility] Patient Name: Lauren Sullivan Procedure Date: 01/02/2020 1:52 PM MRN: [id number] Date of Birth: Feb 13, 1951 Attending MD: Norvel Richards , MD CSN: [id number] Age: 69 Admit Type: Outpatient Procedure:                Colonoscopy Indications:              Chronic diarrhea Providers:                Norvel Richards, MD, Jeanann Lewandowsky. Sharon Seller, RN,                            Randa Spike, Technician Referring MD:              Medicines:                Propofol per Anesthesia Complications:            No immediate complications. Estimated Blood Loss:     Estimated blood loss was minimal. Procedure:                Pre-Anesthesia Assessment:                           - Prior to the procedure, a History and Physical                            was performed, and patient medications and                            allergies were reviewed. The patient's tolerance of                            previous anesthesia was also reviewed. The risks                            and benefits of the procedure and the sedation                            options and risks were discussed with the patient.                            All questions were answered, and informed consent                            was obtained. Prior Anticoagulants: The patient has                            taken no previous anticoagulant or antiplatelet                            agents. ASA Grade Assessment: II - A patient with                            mild systemic disease. After reviewing the risks  and benefits, the patient was deemed in                            satisfactory condition to undergo the procedure.                           After obtaining informed consent, the colonoscope                            was passed under direct vision. Throughout the                            procedure, the patient's blood pressure, pulse, and                            oxygen saturations  were monitored continuously. The                            [id number] scope was introduced through                            the anus and advanced to the the cecum, identified                            by appendiceal orifice and ileocecal valve. The                            ileocecal valve, appendiceal orifice, and rectum                            were photographed. The ileocecal valve, appendiceal                            orifice, and rectum were photographed. The entire                            colon was well visualized. Scope In: 2:19:31 PM Scope Out: 2:36:54 PM Scope Withdrawal Time: 0 hours 13 minutes 29 seconds  Total Procedure Duration: 0 hours 17 minutes 23 seconds  Findings:      The perianal and digital rectal examinations were normal.      Multiple medium-mouthed diverticula were found in the sigmoid colon and       descending colon.      Two sessile polyps were found in the recto-sigmoid colon and cecum. The       polyps were 3 to 5 mm in size. These polyps were removed with a cold       snare. Resection and retrieval were complete. Estimated blood loss was       minimal.      Segmental biopsies of right and left colon taken to further evaluate as       to the cause of diarrhea. Impression:               - Diverticulosis in the sigmoid colon and in the  descending colon.                           - Two 3 to 5 mm polyps at the recto-sigmoid colon                            and in the cecum, removed with a cold snare.                            Resected and retrieved. It is post segmental biopsy Moderate Sedation:      Moderate (conscious) sedation was personally administered by an       anesthesia professional. The following parameters were monitored: oxygen       saturation, heart rate, blood pressure, respiratory rate, EKG, adequacy       of pulmonary ventilation, and response to care. Recommendation:           - Patient has a  contact number available for                            emergencies. The signs and symptoms of potential                            delayed complications were discussed with the                            patient. Return to normal activities tomorrow.                            Written discharge instructions were provided to the                            patient.                           - Advance diet as tolerated.                           - Continue present medications. Continue                            dicyclomine as needed as this appears to be helping                            your diarrhea significantly.                           - Repeat colonoscopy date to be determined after                            pending pathology results are reviewed for                            surveillance based on pathology results.                           -  Return to GI office in 3 months. Procedure Code(s):        --- Professional ---                           [phone], Colonoscopy, flexible; with removal of                            tumor(s), polyp(s), or other lesion(s) by snare                            technique Diagnosis Code(s):        --- Professional ---                           K63.5, Polyp of colon                           K52.9, Noninfective gastroenteritis and colitis,                            unspecified                           K57.30, Diverticulosis of large intestine without                            perforation or abscess without bleeding CPT copyright 2019 American Medical Association. All rights reserved. The codes documented in this report are preliminary and upon coder review may  be revised to meet current compliance requirements. Cristopher Estimable. Eldora Napp, MD Norvel Richards, MD 01/02/2020 2:47:17 PM This report has been signed electronically. Number of Addenda: 0

## 2020-01-02 NOTE — Anesthesia Preprocedure Evaluation (Signed)
Anesthesia Evaluation  Patient identified by MRN, date of birth, ID band Patient awake    Reviewed: Allergy & Precautions, NPO status , Patient's Chart, lab work & pertinent test results, reviewed documented beta blocker date and time   Airway Mallampati: I  TM Distance: >3 FB Neck ROM: Full    Dental no notable dental hx.    Pulmonary former smoker,    Pulmonary exam normal breath sounds clear to auscultation       Cardiovascular METS: hypertension, Pt. on medications and Pt. on home beta blockers Normal cardiovascular exam Rhythm:Regular Rate:Normal     Neuro/Psych  Headaches,  Neuromuscular disease negative psych ROS   GI/Hepatic Neg liver ROS, GERD  Medicated,  Endo/Other  diabetes  Renal/GU negative Renal ROS     Musculoskeletal  (+) Arthritis ,   Abdominal   Peds  Hematology   Anesthesia Other Findings   Reproductive/Obstetrics                            Anesthesia Physical Anesthesia Plan  ASA: II  Anesthesia Plan: General   Post-op Pain Management:    Induction: Intravenous  PONV Risk Score and Plan: 2 and TIVA  Airway Management Planned: Nasal Cannula, Natural Airway and Simple Face Mask  Additional Equipment:   Intra-op Plan:   Post-operative Plan:   Informed Consent: I have reviewed the patients History and Physical, chart, labs and discussed the procedure including the risks, benefits and alternatives for the proposed anesthesia with the patient or authorized representative who has indicated his/her understanding and acceptance.     Dental advisory given  Plan Discussed with: CRNA and Surgeon  Anesthesia Plan Comments:        Anesthesia Quick Evaluation

## 2020-01-02 NOTE — H&P (Signed)
@LOGO @   Primary Care Physician:  Maryruth Hancock, MD Primary Gastroenterologist:  Dr. Gala Romney  Pre-Procedure History & Physical: HPI:  Lauren Sullivan is a 69 y.o. female here for further evaluation of left-sided abdominal pain and chronic diarrhea via colonoscopy.    Past Medical History:  Diagnosis Date  . Arthritis   . Blood transfusion without reported diagnosis   . Breast CA ([place])   . Cataract   . Diabetes mellitus without complication ([place])   . GERD (gastroesophageal reflux disease)   . Hypertension     Past Surgical History:  Procedure Laterality Date  . BREAST SURGERY    . CHOLECYSTECTOMY    . COLONOSCOPY  2008   Dr. Gala Romney: Minimally friable anal canal, patient only boggy edematous terminal ileum.  Biopsies from the terminal ileum were benign.  Random colon biopsies benign.  No evidence of microscopic colitis or inflammatory bowel disease.  Marland Kitchen EYE SURGERY    . TUBAL LIGATION      Prior to Admission medications   Medication Sig Start Date End Date Taking? Authorizing Provider  acetaminophen (TYLENOL) 500 MG tablet Take 1,000 mg by mouth every 6 (six) hours as needed for headache.   Yes [provider]  amLODipine (NORVASC) 10 MG tablet Take 10 mg by mouth daily.   Yes [provider]  dicyclomine (BENTYL) 10 MG capsule Take up to three times daily, preferably before a meal, as needed for left sided abdominal pain and diarrhea. Hold for constipation. Patient taking differently: Take 10 mg by mouth daily.  11/21/19  Yes Mahala Menghini, PA-C  metoprolol tartrate (LOPRESSOR) 100 MG tablet TAKE 1 TABLET BY MOUTH TWICE DAILY. Patient taking differently: Take 100 mg by mouth 2 (two) times daily.  12/19/19  Yes Corum, Rex Kras, MD  naproxen (NAPROSYN) 500 MG tablet Take 500 mg by mouth daily.    Yes [provider]  nortriptyline (PAMELOR) 50 MG capsule Take 50 mg by mouth daily.    Yes [provider]  omeprazole (PRILOSEC) 20 MG capsule Take 20 mg by  mouth daily.   Yes [provider]  polyethylene glycol-electrolytes (TRILYTE) 420 g solution Take 4,000 mLs by mouth as directed. Patient taking differently: Take 4,000 mLs by mouth once.  11/21/19  Yes Aveya Beal, Cristopher Estimable, MD  predniSONE (DELTASONE) 10 MG tablet Take 10 mg by mouth as needed (Back pain).  12/19/19  Yes [provider]  Semaglutide (OZEMPIC, 1 MG/DOSE, [place]) Inject 1 mg into the skin once a week. Tuesday   Yes [provider]  fluconazole (DIFLUCAN) 150 MG tablet Take 150 mg by mouth as needed (Yeast infection).  12/19/19   [provider]    Allergies as of 11/22/2019 - Review Complete 11/21/2019  Allergen Reaction Noted  . Vancomycin Itching and Rash 01/27/2011    Family History  Problem Relation Age of Onset  . Cancer Mother        pancreatic  . Heart disease Mother   . Hypertension Mother   . Hypertension Father   . Colon cancer Neg Hx     Social History   Socioeconomic History  . Marital status: Married    Spouse name: Not on file  . Number of children: Not on file  . Years of education: Not on file  . Highest education level: Not on file  Occupational History  . Occupation: part time Network engineer  Tobacco Use  . Smoking status: Former Research scientist (life sciences)  . Smokeless tobacco: Never  Used  Substance and Sexual Activity  . Alcohol use: Never  . Drug use: Never  . Sexual activity: Yes  Other Topics Concern  . Not on file  Social History Narrative  . Not on file   Social Determinants of Health   Financial Resource Strain:   . Difficulty of Paying Living Expenses:   Food Insecurity:   . Worried About Charity fundraiser in the Last Year:   . Arboriculturist in the Last Year:   Transportation Needs:   . Film/video editor (Medical):   Marland Kitchen Lack of Transportation (Non-Medical):   Physical Activity:   . Days of Exercise per Week:   . Minutes of Exercise per Session:   Stress:   . Feeling of Stress :   Social Connections:   .  Frequency of Communication with Friends and Family:   . Frequency of Social Gatherings with Friends and Family:   . Attends Religious Services:   . Active Member of Clubs or Organizations:   . Attends Archivist Meetings:   Marland Kitchen Marital Status:   Intimate Partner Violence:   . Fear of Current or Ex-Partner:   . Emotionally Abused:   Marland Kitchen Physically Abused:   . Sexually Abused:     Review of Systems: See HPI, otherwise negative ROS  Physical Exam: BP 125/72   Pulse 91   Temp 97.7 F (36.5 C) (Oral)   Resp 13   Ht 5\' 6"  (1.676 m)   SpO2 97%   BMI 34.02 kg/m  General:   Alert,  Well-developed, well-nourished, pleasant and cooperative in NAD Skin:  Intact without significant lesions or rashes. Neck:  Supple; no masses or thyromegaly. No significant cervical adenopathy. Lungs:  Clear throughout to auscultation.   No wheezes, crackles, or rhonchi. No acute distress. Heart:  Regular rate and rhythm; no murmurs, clicks, rubs,  or gallops. Abdomen: Non-distended, normal bowel sounds.  Soft and nontender without appreciable mass or hepatosplenomegaly.   Impression/Plan: 69 year old lady with chronic diarrhea improved with dicyclomine recently.  Colonoscopy now being done to rule out microscopic colitis, etc.The risks, benefits, limitations, alternatives and imponderables have been reviewed with the patient. Questions have been answered. All parties are agreeable.      Notice: This dictation was prepared with Dragon dictation along with smaller phrase technology. Any transcriptional errors that result from this process are unintentional and may not be corrected upon review.

## 2020-01-03 NOTE — Anesthesia Postprocedure Evaluation (Signed)
Anesthesia Post Note  Patient: Lauren Sullivan  Procedure(s) Performed: COLONOSCOPY WITH PROPOFOL (N/A ) POLYPECTOMY BIOPSY  Patient location during evaluation: PACU Anesthesia Type: General Level of consciousness: awake and alert and oriented Pain management: pain level controlled Vital Signs Assessment: post-procedure vital signs reviewed and stable Respiratory status: spontaneous breathing and respiratory function stable Cardiovascular status: blood pressure returned to baseline Postop Assessment: no apparent nausea or vomiting Anesthetic complications: no     Last Vitals:  Vitals:   01/02/20 1500 01/02/20 1509  BP: 116/69 118/71  Pulse: 79 77  Resp: (!) 22 16  Temp:  36.6 C  SpO2: 100% 96%    Last Pain:  Vitals:   01/02/20 1509  TempSrc: Oral  PainSc: 0-No pain                 Caera Enwright C Salvador Coupe

## 2020-01-04 ENCOUNTER — Encounter: Payer: Self-pay | Admitting: Internal Medicine

## 2020-01-04 LAB — SURGICAL PATHOLOGY

## 2020-01-16 ENCOUNTER — Other Ambulatory Visit: Payer: Self-pay | Admitting: Gastroenterology

## 2020-01-18 ENCOUNTER — Other Ambulatory Visit: Payer: Self-pay | Admitting: Family Medicine

## 2020-02-06 DIAGNOSIS — R079 Chest pain, unspecified: Secondary | ICD-10-CM | POA: Diagnosis not present

## 2020-02-06 DIAGNOSIS — Z853 Personal history of malignant neoplasm of breast: Secondary | ICD-10-CM | POA: Diagnosis not present

## 2020-02-06 DIAGNOSIS — Z0189 Encounter for other specified special examinations: Secondary | ICD-10-CM | POA: Diagnosis not present

## 2020-02-06 DIAGNOSIS — G130 Paraneoplastic neuromyopathy and neuropathy: Secondary | ICD-10-CM | POA: Diagnosis not present

## 2020-02-06 DIAGNOSIS — R197 Diarrhea, unspecified: Secondary | ICD-10-CM | POA: Diagnosis not present

## 2020-02-06 DIAGNOSIS — E1165 Type 2 diabetes mellitus with hyperglycemia: Secondary | ICD-10-CM | POA: Diagnosis not present

## 2020-02-06 DIAGNOSIS — K219 Gastro-esophageal reflux disease without esophagitis: Secondary | ICD-10-CM | POA: Diagnosis not present

## 2020-02-06 DIAGNOSIS — M130 Polyarthritis, unspecified: Secondary | ICD-10-CM | POA: Diagnosis not present

## 2020-02-06 DIAGNOSIS — B373 Candidiasis of vulva and vagina: Secondary | ICD-10-CM | POA: Diagnosis not present

## 2020-02-06 DIAGNOSIS — I10 Essential (primary) hypertension: Secondary | ICD-10-CM | POA: Diagnosis not present

## 2020-02-13 DIAGNOSIS — Z961 Presence of intraocular lens: Secondary | ICD-10-CM | POA: Diagnosis not present

## 2020-02-17 DIAGNOSIS — C50011 Malignant neoplasm of nipple and areola, right female breast: Secondary | ICD-10-CM | POA: Diagnosis not present

## 2020-03-07 ENCOUNTER — Ambulatory Visit: Payer: PPO | Admitting: Family Medicine

## 2020-03-07 DIAGNOSIS — Z0000 Encounter for general adult medical examination without abnormal findings: Secondary | ICD-10-CM | POA: Diagnosis not present

## 2020-03-07 DIAGNOSIS — R7301 Impaired fasting glucose: Secondary | ICD-10-CM | POA: Diagnosis not present

## 2020-03-07 DIAGNOSIS — I10 Essential (primary) hypertension: Secondary | ICD-10-CM | POA: Diagnosis not present

## 2020-03-14 DIAGNOSIS — R197 Diarrhea, unspecified: Secondary | ICD-10-CM | POA: Diagnosis not present

## 2020-03-14 DIAGNOSIS — Z853 Personal history of malignant neoplasm of breast: Secondary | ICD-10-CM | POA: Diagnosis not present

## 2020-03-14 DIAGNOSIS — I10 Essential (primary) hypertension: Secondary | ICD-10-CM | POA: Diagnosis not present

## 2020-03-14 DIAGNOSIS — K219 Gastro-esophageal reflux disease without esophagitis: Secondary | ICD-10-CM | POA: Diagnosis not present

## 2020-03-14 DIAGNOSIS — B373 Candidiasis of vulva and vagina: Secondary | ICD-10-CM | POA: Diagnosis not present

## 2020-03-14 DIAGNOSIS — G130 Paraneoplastic neuromyopathy and neuropathy: Secondary | ICD-10-CM | POA: Diagnosis not present

## 2020-03-14 DIAGNOSIS — E785 Hyperlipidemia, unspecified: Secondary | ICD-10-CM | POA: Diagnosis not present

## 2020-03-14 DIAGNOSIS — R079 Chest pain, unspecified: Secondary | ICD-10-CM | POA: Diagnosis not present

## 2020-03-14 DIAGNOSIS — E1165 Type 2 diabetes mellitus with hyperglycemia: Secondary | ICD-10-CM | POA: Diagnosis not present

## 2020-03-14 DIAGNOSIS — M130 Polyarthritis, unspecified: Secondary | ICD-10-CM | POA: Diagnosis not present

## 2020-04-11 ENCOUNTER — Ambulatory Visit (INDEPENDENT_AMBULATORY_CARE_PROVIDER_SITE_OTHER): Payer: PPO | Admitting: Gastroenterology

## 2020-04-11 ENCOUNTER — Encounter: Payer: Self-pay | Admitting: Gastroenterology

## 2020-04-11 ENCOUNTER — Other Ambulatory Visit: Payer: Self-pay

## 2020-04-11 VITALS — BP 141/84 | HR 77 | Temp 98.2°F | Ht 65.0 in | Wt 209.2 lb

## 2020-04-11 DIAGNOSIS — R197 Diarrhea, unspecified: Secondary | ICD-10-CM | POA: Diagnosis not present

## 2020-04-11 DIAGNOSIS — K760 Fatty (change of) liver, not elsewhere classified: Secondary | ICD-10-CM

## 2020-04-11 NOTE — Progress Notes (Signed)
Primary Care Physician: Celene Squibb, MD  Primary Gastroenterologist:  Garfield Cornea, MD   Chief Complaint  Patient presents with  . Diarrhea    it did improve but the past few weeks it has been terrible. Unsure if it is related to starting lisinopril few weeks ago. Can't control bowels at times    HPI: Lauren Sullivan is a 69 y.o. female here for follow-up.  Seen back in February for acute on chronic diarrhea, left-sided abdominal pain, rectal bleeding.  She has had chronic diarrhea for more than a decade. Colonoscopy August 2008 for chronic diarrhea and rectal bleeding showed minimally friable anal canal.  Questionable boggy edematous terminal ileum.  Biopsies from the terminal ileum were benign.  Random colon biopsies benign.  No evidence of microscopic colitis or inflammatory bowel disease.  Prior to office visit in 11/2019, symptoms had been worse.  Increased urgency associated with fecal incontinence.  Some nocturnal diarrhea.  30-day course of prednisone off and on for her back and arthritis did not help her diarrhea.  Probiotics without improvement. Had tried pepto and imodium after episodes would start and did seem to help. Recent celiac serologies unremarkable.  Colonoscopy March 2021, 2 polyps removed though tubular adenomas.  Random colon biopsies unremarkable.  Colonoscopy in 5 years recommended.  At time of colonoscopy patient reported Bentyl was providing good relief of her diarrhea.  Overall she feels like bentyl has helped. She general takes one before dinner because she doesn't eat much at breakfast and lunch and seems to do ok. Some days she has taken an extra bentyl if having frequent loose stools. Almost seemed like two bentyl a day made symptoms worse. Bentyl has helped abdominal cramping. Has made it possible for her to eat salad again. Also when she does have postprandial urgency and diarrhea she usually just has one episode and then its done as opposed to before when she  would have to go back several times. Up until about two weeks ago, she could actually go a couple of weeks between episodes. Worse last two weeks since on lisinopril. No constipation. Will still have daily BM.   She wants [place] to follow up on her fatty liver. Was told about it a long time ago. CT A/P with contrast in 2012 showed hepatic steatosis.  Labs from November 2020: LFTs normal.  35 pound weight loss 10/2018 through 02/2019 around the time she started Ozempic. Weight stable all this year.      Wt Readings from Last 3 Encounters:  04/11/20 209 lb 3.2 oz (94.9 kg)  11/21/19 210 lb 12.8 oz (95.6 kg)  11/07/19 210 lb (95.3 kg)    Current Outpatient Medications  Medication Sig Dispense Refill  . acetaminophen (TYLENOL) 500 MG tablet Take 1,000 mg by mouth every 6 (six) hours as needed for headache.    Marland Kitchen amLODipine (NORVASC) 10 MG tablet Take 10 mg by mouth daily.    Marland Kitchen dicyclomine (BENTYL) 10 MG capsule TAKE UP TO 3 TIMES DAILY BEFORE A MEAL AS NEEDED FOR ABDOMINAL PAIN AND DIARRHEA. HOLD IF CONSTIPATED. 90 capsule 1  . fluconazole (DIFLUCAN) 150 MG tablet Take 150 mg by mouth as needed (Yeast infection).     Marland Kitchen lisinopril (ZESTRIL) 2.5 MG tablet Take 2.5 mg by mouth daily.    . metoprolol tartrate (LOPRESSOR) 100 MG tablet Take 1 tablet (100 mg total) by mouth 2 (two) times daily. 60 tablet 3  . naproxen (NAPROSYN) 500 MG tablet  Take 500 mg by mouth daily.     . nortriptyline (PAMELOR) 50 MG capsule Take 50 mg by mouth daily.     Marland Kitchen omeprazole (PRILOSEC) 20 MG capsule Take 20 mg by mouth daily.    . predniSONE (DELTASONE) 10 MG tablet Take 10 mg by mouth as needed (Back pain).     . Semaglutide (OZEMPIC, 1 MG/DOSE, [place]) Inject 1 mg into the skin once a week. Tuesday     No current facility-administered medications for this visit.    Allergies as of 04/11/2020 - Review Complete 04/11/2020  Allergen Reaction Noted  . Tape Dermatitis 01/02/2020  . Vancomycin Itching and Rash 01/27/2011     ROS:  General: Negative for anorexia, weight loss, fever, chills, fatigue, weakness. ENT: Negative for hoarseness, difficulty swallowing , nasal congestion. CV: Negative for chest pain, angina, palpitations, dyspnea on exertion, peripheral edema.  Respiratory: Negative for dyspnea at rest, dyspnea on exertion, cough, sputum, wheezing.  GI: See history of present illness. GU:  Negative for dysuria, hematuria, urinary incontinence, urinary frequency, nocturnal urination.  Endo: Negative for unusual weight change.    Physical Examination:   BP (!) 141/84   Pulse 77   Temp 98.2 F (36.8 C) (Oral)   Ht 5\' 5"  (1.651 m)   Wt 209 lb 3.2 oz (94.9 kg)   BMI 34.81 kg/m   General: Well-nourished, well-developed in no acute distress.  Eyes: No icterus. Mouth: masked Lungs: Clear to auscultation bilaterally.  Heart: Regular rate and rhythm, no murmurs rubs or gallops.  Abdomen: Bowel sounds are normal, nontender, nondistended, no hepatosplenomegaly or masses, no abdominal bruits or hernia , no rebound or guarding.   Extremities: No lower extremity edema. No clubbing or deformities. Neuro: Alert and oriented x 4   Skin: Warm and dry, no jaundice.   Psych: Alert and cooperative, normal mood and affect.  Labs:  Lab Results  Component Value Date   CREATININE 0.77 08/22/2019   CREATININE 0.77 08/22/2019   BUN 15 08/22/2019   BUN 15 08/22/2019   NA 137 08/22/2019   NA 137 08/22/2019   K 4.3 08/22/2019   K 4.3 08/22/2019   CL 105 08/22/2019   CL 105 08/22/2019   CO2 23 08/22/2019   CO2 23 08/22/2019   Lab Results  Component Value Date   ALT 22 08/22/2019   ALT 22 08/22/2019   AST 22 08/22/2019   AST 22 08/22/2019   ALKPHOS 125 08/22/2019   ALKPHOS 125 08/22/2019   BILITOT 0.4 08/22/2019   BILITOT 0.4 08/22/2019   Lab Results  Component Value Date   WBC 6.9 08/22/2019   HGB 13.7 02/08/2011   HCT 44 (A) 08/22/2019   MCV 88.2 08/22/2019   PLT 331 02/08/2011   Lab  Results  Component Value Date   HGBA1C 6.2 (A) 08/22/2019    Imaging Studies: No results found.   Impression/plan:  Pleasant 69 year old female presenting for follow-up of chronic diarrhea associated with urgency and some fecal incontinence.  Celiac serologies were negative.  Colonoscopy completed March 2021, 2 tubular adenomas removed, random colon biopsies negative.  Next colonoscopy planned for 5 years.  She has noticed some improvement on Bentyl.  Typically takes 10 mg prior to her largest meal of the day which is her evening meal.  Initially seem to be very helpful and actually go a couple of weeks without any of her "bad episodes".  Bad episodes consist of frequent loose stools associated with incontinence.  Couple of  weeks ago she feels like Bentyl has lost some efficacy.  She is not sure if this is related to starting lisinopril.  Recently visiting with daughter, had 2 episodes of fecal urgency with incontinence after dinner.  At this point she is afraid to leave her house.  Although her symptoms are not daily, they are unpredictable and it is affecting her lifestyle.  We discussed increasing Bentyl to 10 mg twice daily and in the past she felt like this worsened her diarrhea.  She would like to increase to 20 mg once daily before her largest meal as she typically has not had significant issues with breakfast or lunch.  Denies any nocturnal stools.  Suspect symptoms are related to IBS plus or minus diabetic enteropathy.  She denies fecal incontinence to solid stool which is reassuring.  Will obtain most recent labs from Dr. Juel Burrow office.  Need to make sure thyroid status has been checked.  Regarding fatty liver, seen on remote CT, will follow patient every 6 months with labs, consider updating liver ultrasound after next office visit in 6 months.  Obtain most recent labs done through PCP for review.  She will increase her daily exercise.  Recommend tight glycemic control.  Maintain current  weight.

## 2020-04-11 NOTE — Patient Instructions (Addendum)
1. As discussed today, take total of 20mg  of dicyclomine at least one hour prior to your largest meal of the day. Keep a stool diary noting the number of stools daily, stool consistency (liquid, formed,etc), and any incontinence issues. Call in two weeks and update [place].  2. I will review most recent labs from Dr. Juel Burrow office. I will let you know if you need additional labs.  3. We will keep a check on your fatty liver with labs for now. Next office visit in six months, and consider updating liver ultrasound after that visit.     Fatty Liver Disease  Fatty liver disease occurs when too much fat has built up in your liver cells. Fatty liver disease is also called hepatic steatosis or steatohepatitis. The liver removes harmful substances from your bloodstream and produces fluids that your body needs. It also helps your body use and store energy from the food you eat. In many cases, fatty liver disease does not cause symptoms or problems. It is often diagnosed when tests are being done for other reasons. However, over time, fatty liver can cause inflammation that may lead to more serious liver problems, such as scarring of the liver (cirrhosis) and liver failure. Fatty liver is associated with insulin resistance, increased body fat, high blood pressure (hypertension), and high cholesterol. These are features of metabolic syndrome and increase your risk for stroke, diabetes, and heart disease. What are the causes? This condition may be caused by:  Drinking too much alcohol.  Poor nutrition.  Obesity.  Cushing's syndrome.  Diabetes.  High cholesterol.  Certain drugs.  Poisons.  Some viral infections.  Pregnancy. What increases the risk? You are more likely to develop this condition if you:  Abuse alcohol.  Are overweight.  Have diabetes.  Have hepatitis.  Have a high triglyceride level.  Are pregnant. What are the signs or symptoms? Fatty liver disease often does not cause  symptoms. If symptoms do develop, they can include:  Fatigue.  Weakness.  Weight loss.  Confusion.  Abdominal pain.  Nausea and vomiting.  Yellowing of your skin and the white parts of your eyes (jaundice).  Itchy skin. How is this diagnosed? This condition may be diagnosed by:  A physical exam and medical history.  Blood tests.  Imaging tests, such as an ultrasound, CT scan, or MRI.  A liver biopsy. A small sample of liver tissue is removed using a needle. The sample is then looked at under a microscope. How is this treated? Fatty liver disease is often caused by other health conditions. Treatment for fatty liver may involve medicines and lifestyle changes to manage conditions such as:  Alcoholism.  High cholesterol.  Diabetes.  Being overweight or obese. Follow these instructions at home:   Do not drink alcohol. If you have trouble quitting, ask your health care provider how to safely quit with the help of medicine or a supervised program. This is important to keep your condition from getting worse.  Eat a healthy diet as told by your health care provider. Ask your health care provider about working with a diet and nutrition specialist (dietitian) to develop an eating plan.  Exercise regularly. This can help you lose weight and control your cholesterol and diabetes. Talk to your health care provider about an exercise plan and which activities are best for you.  Take over-the-counter and prescription medicines only as told by your health care provider.  Keep all follow-up visits as told by your health care provider.  This is important. Contact a health care provider if: You have trouble controlling your:  Blood sugar. This is especially important if you have diabetes.  Cholesterol.  Drinking of alcohol. Get help right away if:  You have abdominal pain.  You have jaundice.  You have nausea and vomiting.  You vomit blood or material that looks like  coffee grounds.  You have stools that are black, tar-like, or bloody. Summary  Fatty liver disease develops when too much fat builds up in the cells of your liver.  Fatty liver disease often causes no symptoms or problems. However, over time, fatty liver can cause inflammation that may lead to more serious liver problems, such as scarring of the liver (cirrhosis).  You are more likely to develop this condition if you abuse alcohol, are pregnant, are overweight, have diabetes, have hepatitis, or have high triglyceride levels.  Contact your health care provider if you have trouble controlling your weight, blood sugar, cholesterol, or drinking of alcohol. This information is not intended to replace advice given to you by your health care provider. Make sure you discuss any questions you have with your health care provider. Document Revised: 09/18/2017 Document Reviewed: 07/15/2017 Elsevier Patient Education  2020 [place].     Diet for Irritable Bowel Syndrome When you have irritable bowel syndrome (IBS), it is very important to eat the foods and follow the eating habits that are best for your condition. IBS may cause various symptoms such as pain in the abdomen, constipation, or diarrhea. Choosing the right foods can help to ease the discomfort from these symptoms. Work with your health care provider and diet and nutrition specialist (dietitian) to find the eating plan that will help to control your symptoms. What are tips for following this plan?      Keep a food diary. This will help you identify foods that cause symptoms. Write down: ? What you eat and when you eat it. ? What symptoms you have. ? When symptoms occur in relation to your meals, such as "pain in abdomen 2 hours after dinner."  Eat your meals slowly and in a relaxed setting.  Aim to eat 5-6 small meals per day. Do not skip meals.  Drink enough fluid to keep your urine pale yellow.  Ask your health care provider  if you should take an over-the-counter probiotic to help restore healthy bacteria in your gut (digestive tract). ? Probiotics are foods that contain good bacteria and yeasts.  Your dietitian may have specific dietary recommendations for you based on your symptoms. He or she may recommend that you: ? Avoid foods that cause symptoms. Talk with your dietitian about other ways to get the same nutrients that are in those problem foods. ? Avoid foods with gluten. Gluten is a protein that is found in rye, wheat, and barley. ? Eat more foods that contain soluble fiber. Examples of foods with high soluble fiber include oats, seeds, and certain fruits and vegetables. Take a fiber supplement if directed by your dietitian. ? Reduce or avoid certain foods called FODMAPs. These are foods that contain carbohydrates that are hard to digest. Ask your doctor which foods contain these carbohydrates. What foods are not recommended? The following are some foods and drinks that may make your symptoms worse:  Fatty foods, such as french fries.  Foods that contain gluten, such as pasta and cereal.  Dairy products, such as milk, cheese, and ice cream.  Chocolate.  Alcohol.  Products with caffeine, such as  coffee.  Carbonated drinks, such as soda.  Foods that are high in FODMAPs. These include certain fruits and vegetables.  Products with sweeteners such as honey, high fructose corn syrup, sorbitol, and mannitol. The items listed above may not be a complete list of foods and beverages you should avoid. Contact a dietitian for more information. What foods are good sources of fiber? Your health care provider or dietitian may recommend that you eat more foods that contain fiber. Fiber can help to reduce constipation and other IBS symptoms. Add foods with fiber to your diet a little at a time so your body can get used to them. Too much fiber at one time might cause gas and swelling of your abdomen. The following are  some foods that are good sources of fiber:  Berries, such as raspberries, strawberries, and blueberries.  Tomatoes.  Carrots.  Brown rice.  Oats.  Seeds, such as chia and pumpkin seeds. The items listed above may not be a complete list of recommended sources of fiber. Contact your dietitian for more options. Where to find more information  International Foundation for Functional Gastrointestinal Disorders: www.iffgd.[employer] of Diabetes and Digestive and Kidney Diseases: [employer] Summary  When you have irritable bowel syndrome (IBS), it is very important to eat the foods and follow the eating habits that are best for your condition.  IBS may cause various symptoms such as pain in the abdomen, constipation, or diarrhea.  Choosing the right foods can help to ease the discomfort that comes from symptoms.  Keep a food diary. This will help you identify foods that cause symptoms.  Your health care provider or diet and nutrition specialist (dietitian) may recommend that you eat more foods that contain fiber. This information is not intended to replace advice given to you by your health care provider. Make sure you discuss any questions you have with your health care provider. Document Revised: 01/26/2019 Document Reviewed: 06/09/2017 Elsevier Patient Education  [place].

## 2020-04-12 NOTE — Progress Notes (Signed)
Cc'ed to pcp °

## 2020-04-13 ENCOUNTER — Other Ambulatory Visit: Payer: Self-pay | Admitting: Family Medicine

## 2020-05-31 ENCOUNTER — Encounter: Payer: Self-pay | Admitting: Genetic Counselor

## 2020-07-18 DIAGNOSIS — R7301 Impaired fasting glucose: Secondary | ICD-10-CM | POA: Diagnosis not present

## 2020-07-18 DIAGNOSIS — I10 Essential (primary) hypertension: Secondary | ICD-10-CM | POA: Diagnosis not present

## 2020-07-18 DIAGNOSIS — E785 Hyperlipidemia, unspecified: Secondary | ICD-10-CM | POA: Diagnosis not present

## 2020-07-25 DIAGNOSIS — E1165 Type 2 diabetes mellitus with hyperglycemia: Secondary | ICD-10-CM | POA: Diagnosis not present

## 2020-07-25 DIAGNOSIS — I10 Essential (primary) hypertension: Secondary | ICD-10-CM | POA: Diagnosis not present

## 2020-07-25 DIAGNOSIS — R945 Abnormal results of liver function studies: Secondary | ICD-10-CM | POA: Diagnosis not present

## 2020-07-25 DIAGNOSIS — G130 Paraneoplastic neuromyopathy and neuropathy: Secondary | ICD-10-CM | POA: Diagnosis not present

## 2020-07-25 DIAGNOSIS — Z0001 Encounter for general adult medical examination with abnormal findings: Secondary | ICD-10-CM | POA: Diagnosis not present

## 2020-07-25 DIAGNOSIS — M130 Polyarthritis, unspecified: Secondary | ICD-10-CM | POA: Diagnosis not present

## 2020-07-25 DIAGNOSIS — E785 Hyperlipidemia, unspecified: Secondary | ICD-10-CM | POA: Diagnosis not present

## 2020-07-25 DIAGNOSIS — R197 Diarrhea, unspecified: Secondary | ICD-10-CM | POA: Diagnosis not present

## 2020-07-25 DIAGNOSIS — K219 Gastro-esophageal reflux disease without esophagitis: Secondary | ICD-10-CM | POA: Diagnosis not present

## 2020-07-25 DIAGNOSIS — R079 Chest pain, unspecified: Secondary | ICD-10-CM | POA: Diagnosis not present

## 2020-07-25 DIAGNOSIS — B373 Candidiasis of vulva and vagina: Secondary | ICD-10-CM | POA: Diagnosis not present

## 2020-07-25 DIAGNOSIS — Z853 Personal history of malignant neoplasm of breast: Secondary | ICD-10-CM | POA: Diagnosis not present

## 2020-07-25 DIAGNOSIS — B351 Tinea unguium: Secondary | ICD-10-CM | POA: Diagnosis not present

## 2020-09-07 ENCOUNTER — Telehealth: Payer: Self-pay | Admitting: Gastroenterology

## 2020-09-07 NOTE — Telephone Encounter (Signed)
Labs from PCP dated 02/2020 Wbc 6600, H/H 14.4/44.7, Platelet 340000 Glucose 199 Bun 15 cre 0.76 tbili 0.4 AP 129 AST 25 ALT 23  A1C 6.5  Patient has upcoming ov.

## 2020-10-08 ENCOUNTER — Ambulatory Visit: Payer: PPO | Admitting: Gastroenterology

## 2020-10-15 ENCOUNTER — Other Ambulatory Visit: Payer: Self-pay | Admitting: Nurse Practitioner

## 2021-01-21 DIAGNOSIS — I10 Essential (primary) hypertension: Secondary | ICD-10-CM | POA: Diagnosis not present

## 2021-01-21 DIAGNOSIS — G130 Paraneoplastic neuromyopathy and neuropathy: Secondary | ICD-10-CM | POA: Diagnosis not present

## 2021-01-21 DIAGNOSIS — R197 Diarrhea, unspecified: Secondary | ICD-10-CM | POA: Diagnosis not present

## 2021-01-21 DIAGNOSIS — R079 Chest pain, unspecified: Secondary | ICD-10-CM | POA: Diagnosis not present

## 2021-01-21 DIAGNOSIS — B351 Tinea unguium: Secondary | ICD-10-CM | POA: Diagnosis not present

## 2021-01-21 DIAGNOSIS — B373 Candidiasis of vulva and vagina: Secondary | ICD-10-CM | POA: Diagnosis not present

## 2021-01-21 DIAGNOSIS — E785 Hyperlipidemia, unspecified: Secondary | ICD-10-CM | POA: Diagnosis not present

## 2021-01-21 DIAGNOSIS — Z853 Personal history of malignant neoplasm of breast: Secondary | ICD-10-CM | POA: Diagnosis not present

## 2021-01-21 DIAGNOSIS — R945 Abnormal results of liver function studies: Secondary | ICD-10-CM | POA: Diagnosis not present

## 2021-01-21 DIAGNOSIS — M130 Polyarthritis, unspecified: Secondary | ICD-10-CM | POA: Diagnosis not present

## 2021-01-21 DIAGNOSIS — K219 Gastro-esophageal reflux disease without esophagitis: Secondary | ICD-10-CM | POA: Diagnosis not present

## 2021-01-21 DIAGNOSIS — E1165 Type 2 diabetes mellitus with hyperglycemia: Secondary | ICD-10-CM | POA: Diagnosis not present

## 2021-01-28 DIAGNOSIS — R945 Abnormal results of liver function studies: Secondary | ICD-10-CM | POA: Diagnosis not present

## 2021-01-28 DIAGNOSIS — G130 Paraneoplastic neuromyopathy and neuropathy: Secondary | ICD-10-CM | POA: Diagnosis not present

## 2021-01-28 DIAGNOSIS — R197 Diarrhea, unspecified: Secondary | ICD-10-CM | POA: Diagnosis not present

## 2021-01-28 DIAGNOSIS — Z853 Personal history of malignant neoplasm of breast: Secondary | ICD-10-CM | POA: Diagnosis not present

## 2021-01-28 DIAGNOSIS — K219 Gastro-esophageal reflux disease without esophagitis: Secondary | ICD-10-CM | POA: Diagnosis not present

## 2021-01-28 DIAGNOSIS — B373 Candidiasis of vulva and vagina: Secondary | ICD-10-CM | POA: Diagnosis not present

## 2021-01-28 DIAGNOSIS — R079 Chest pain, unspecified: Secondary | ICD-10-CM | POA: Diagnosis not present

## 2021-01-28 DIAGNOSIS — E785 Hyperlipidemia, unspecified: Secondary | ICD-10-CM | POA: Diagnosis not present

## 2021-01-28 DIAGNOSIS — B351 Tinea unguium: Secondary | ICD-10-CM | POA: Diagnosis not present

## 2021-01-28 DIAGNOSIS — E1165 Type 2 diabetes mellitus with hyperglycemia: Secondary | ICD-10-CM | POA: Diagnosis not present

## 2021-01-28 DIAGNOSIS — I10 Essential (primary) hypertension: Secondary | ICD-10-CM | POA: Diagnosis not present

## 2021-01-28 DIAGNOSIS — M130 Polyarthritis, unspecified: Secondary | ICD-10-CM | POA: Diagnosis not present

## 2021-02-18 DIAGNOSIS — H524 Presbyopia: Secondary | ICD-10-CM | POA: Diagnosis not present

## 2021-02-18 DIAGNOSIS — H5212 Myopia, left eye: Secondary | ICD-10-CM | POA: Diagnosis not present

## 2021-02-18 DIAGNOSIS — Z961 Presence of intraocular lens: Secondary | ICD-10-CM | POA: Diagnosis not present

## 2021-02-18 DIAGNOSIS — H5201 Hypermetropia, right eye: Secondary | ICD-10-CM | POA: Diagnosis not present

## 2021-02-18 DIAGNOSIS — E119 Type 2 diabetes mellitus without complications: Secondary | ICD-10-CM | POA: Diagnosis not present

## 2021-02-18 DIAGNOSIS — H52202 Unspecified astigmatism, left eye: Secondary | ICD-10-CM | POA: Diagnosis not present

## 2021-04-29 DIAGNOSIS — E1165 Type 2 diabetes mellitus with hyperglycemia: Secondary | ICD-10-CM | POA: Diagnosis not present

## 2021-04-29 DIAGNOSIS — I10 Essential (primary) hypertension: Secondary | ICD-10-CM | POA: Diagnosis not present

## 2021-04-29 DIAGNOSIS — E785 Hyperlipidemia, unspecified: Secondary | ICD-10-CM | POA: Diagnosis not present

## 2021-05-06 ENCOUNTER — Other Ambulatory Visit (HOSPITAL_COMMUNITY): Payer: Self-pay | Admitting: Internal Medicine

## 2021-05-06 DIAGNOSIS — I10 Essential (primary) hypertension: Secondary | ICD-10-CM | POA: Diagnosis not present

## 2021-05-06 DIAGNOSIS — M130 Polyarthritis, unspecified: Secondary | ICD-10-CM | POA: Diagnosis not present

## 2021-05-06 DIAGNOSIS — E1165 Type 2 diabetes mellitus with hyperglycemia: Secondary | ICD-10-CM | POA: Diagnosis not present

## 2021-05-06 DIAGNOSIS — B373 Candidiasis of vulva and vagina: Secondary | ICD-10-CM | POA: Diagnosis not present

## 2021-05-06 DIAGNOSIS — R945 Abnormal results of liver function studies: Secondary | ICD-10-CM | POA: Diagnosis not present

## 2021-05-06 DIAGNOSIS — M542 Cervicalgia: Secondary | ICD-10-CM

## 2021-05-06 DIAGNOSIS — K719 Toxic liver disease, unspecified: Secondary | ICD-10-CM | POA: Diagnosis not present

## 2021-05-06 DIAGNOSIS — K219 Gastro-esophageal reflux disease without esophagitis: Secondary | ICD-10-CM | POA: Diagnosis not present

## 2021-05-06 DIAGNOSIS — G130 Paraneoplastic neuromyopathy and neuropathy: Secondary | ICD-10-CM | POA: Diagnosis not present

## 2021-05-06 DIAGNOSIS — Z853 Personal history of malignant neoplasm of breast: Secondary | ICD-10-CM | POA: Diagnosis not present

## 2021-05-06 DIAGNOSIS — B351 Tinea unguium: Secondary | ICD-10-CM | POA: Diagnosis not present

## 2021-05-06 DIAGNOSIS — E782 Mixed hyperlipidemia: Secondary | ICD-10-CM | POA: Diagnosis not present

## 2021-05-06 DIAGNOSIS — K580 Irritable bowel syndrome with diarrhea: Secondary | ICD-10-CM | POA: Diagnosis not present

## 2021-05-07 DIAGNOSIS — C50011 Malignant neoplasm of nipple and areola, right female breast: Secondary | ICD-10-CM | POA: Diagnosis not present

## 2021-05-13 ENCOUNTER — Ambulatory Visit (HOSPITAL_COMMUNITY)
Admission: RE | Admit: 2021-05-13 | Discharge: 2021-05-13 | Disposition: A | Discharge status: Home / Self Care | Payer: PPO | Source: Ambulatory Visit | Attending: Internal Medicine | Admitting: Internal Medicine

## 2021-05-13 ENCOUNTER — Other Ambulatory Visit: Payer: Self-pay

## 2021-05-13 DIAGNOSIS — I6523 Occlusion and stenosis of bilateral carotid arteries: Secondary | ICD-10-CM | POA: Diagnosis not present

## 2021-05-13 DIAGNOSIS — M542 Cervicalgia: Secondary | ICD-10-CM | POA: Insufficient documentation

## 2021-06-12 DIAGNOSIS — K580 Irritable bowel syndrome with diarrhea: Secondary | ICD-10-CM | POA: Diagnosis not present

## 2021-06-12 DIAGNOSIS — K719 Toxic liver disease, unspecified: Secondary | ICD-10-CM | POA: Diagnosis not present

## 2021-06-12 DIAGNOSIS — K219 Gastro-esophageal reflux disease without esophagitis: Secondary | ICD-10-CM | POA: Diagnosis not present

## 2021-06-12 DIAGNOSIS — B373 Candidiasis of vulva and vagina: Secondary | ICD-10-CM | POA: Diagnosis not present

## 2021-06-12 DIAGNOSIS — R945 Abnormal results of liver function studies: Secondary | ICD-10-CM | POA: Diagnosis not present

## 2021-06-12 DIAGNOSIS — Z853 Personal history of malignant neoplasm of breast: Secondary | ICD-10-CM | POA: Diagnosis not present

## 2021-06-12 DIAGNOSIS — Z0001 Encounter for general adult medical examination with abnormal findings: Secondary | ICD-10-CM | POA: Diagnosis not present

## 2021-06-12 DIAGNOSIS — I10 Essential (primary) hypertension: Secondary | ICD-10-CM | POA: Diagnosis not present

## 2021-06-12 DIAGNOSIS — G130 Paraneoplastic neuromyopathy and neuropathy: Secondary | ICD-10-CM | POA: Diagnosis not present

## 2021-06-12 DIAGNOSIS — E1165 Type 2 diabetes mellitus with hyperglycemia: Secondary | ICD-10-CM | POA: Diagnosis not present

## 2021-06-12 DIAGNOSIS — M130 Polyarthritis, unspecified: Secondary | ICD-10-CM | POA: Diagnosis not present

## 2021-06-12 DIAGNOSIS — E782 Mixed hyperlipidemia: Secondary | ICD-10-CM | POA: Diagnosis not present

## 2021-06-13 IMAGING — MR MR LUMBAR SPINE W/O CM
4 of 5 series · 13 of 48 positions shown · non-contrast
Comparison: 09/17/2010

CLINICAL DATA: Low back pain for 3 months

EXAM:
MRI LUMBAR SPINE WITHOUT CONTRAST
TECHNIQUE: Multiplanar, multisequence MR imaging of the lumbar spine was
performed. No intravenous contrast was administered.

[Series 3: T2 · sagittal · 4.0mm · 0.42mm/px · 4 of 15 slices shown (1 of 2)]
[im 1/15]
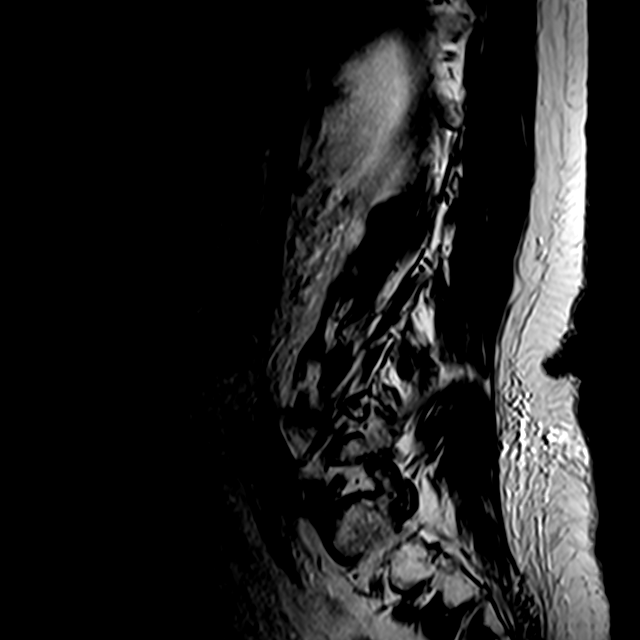
[im 3/15]
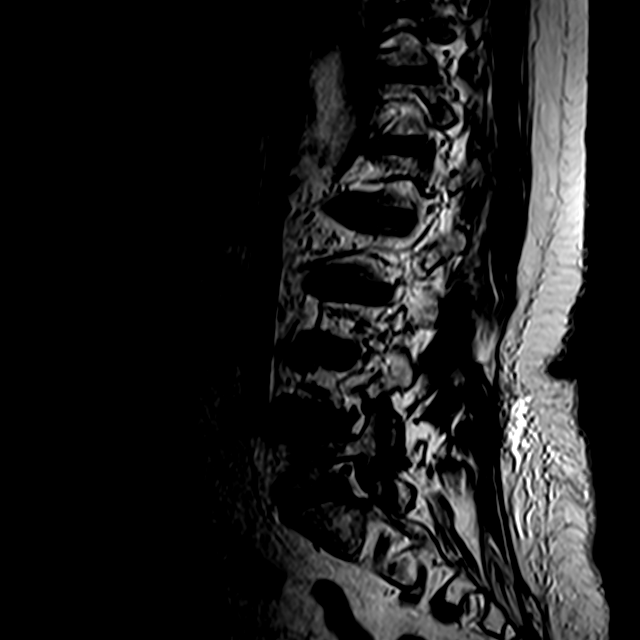
[im 8/15]
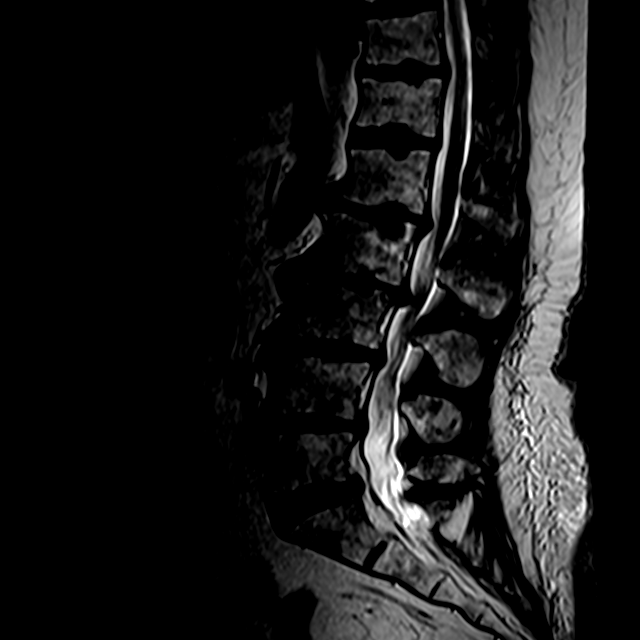
[im 12/15]
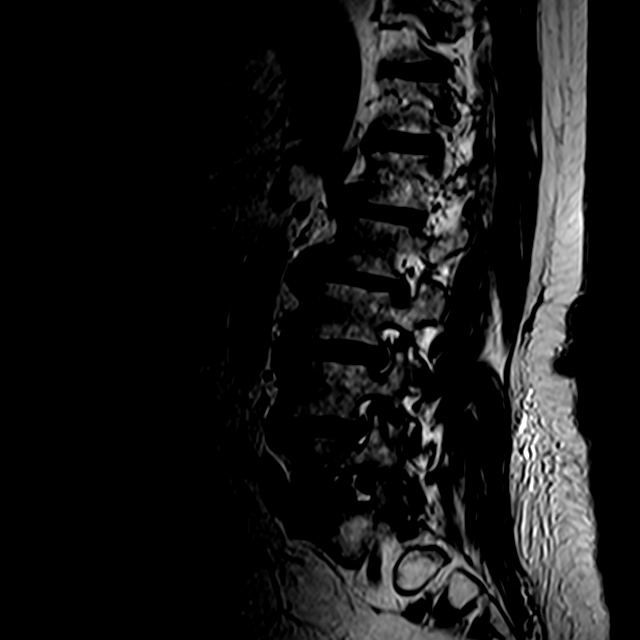

[Series 4: T1 · sagittal · 4.0mm · 0.42mm/px · 3 of 15 slices shown (1 of 2)]
[im 3/15]
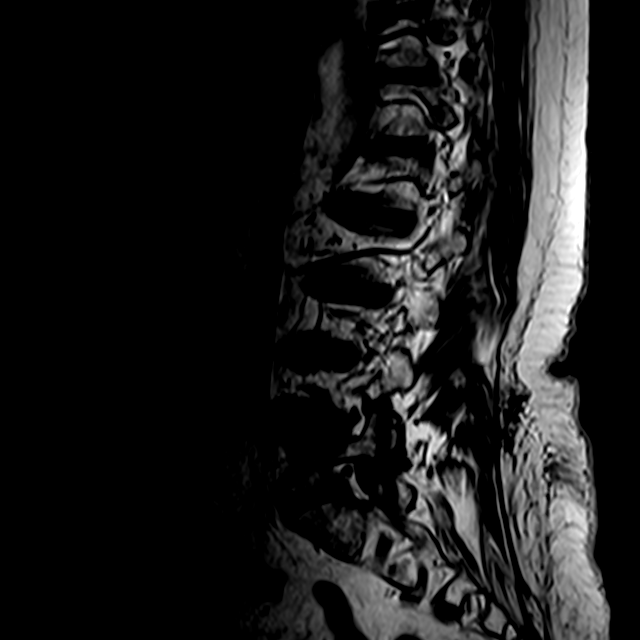
[im 9/15]
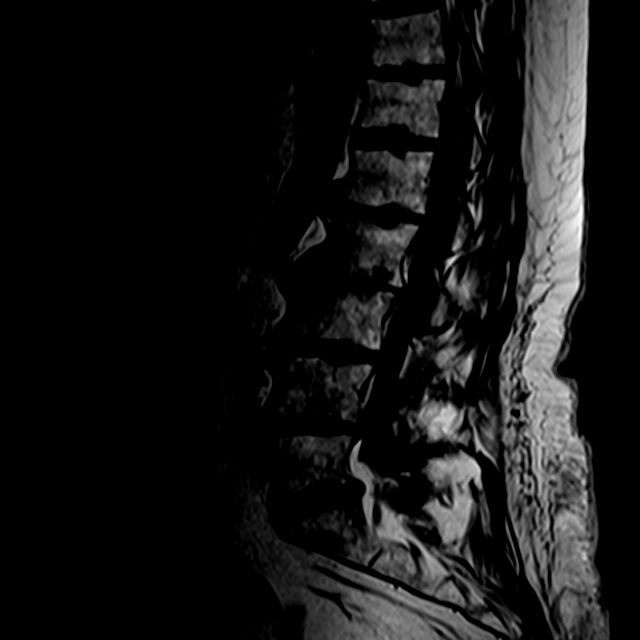
[im 15/15]
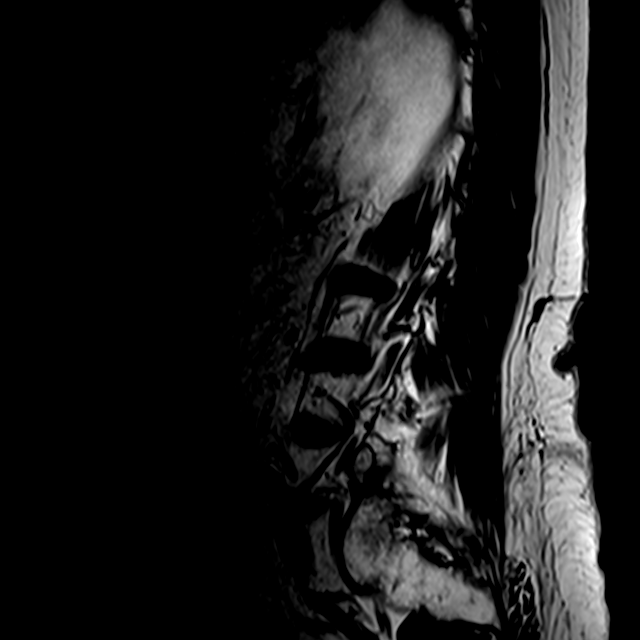

[Series 9: T1 · axial · 4.0mm · 0.25mm/px · z∈[-4,+114]mm · 3 of 36 slices shown (2 of 2)]
[im 6/36]
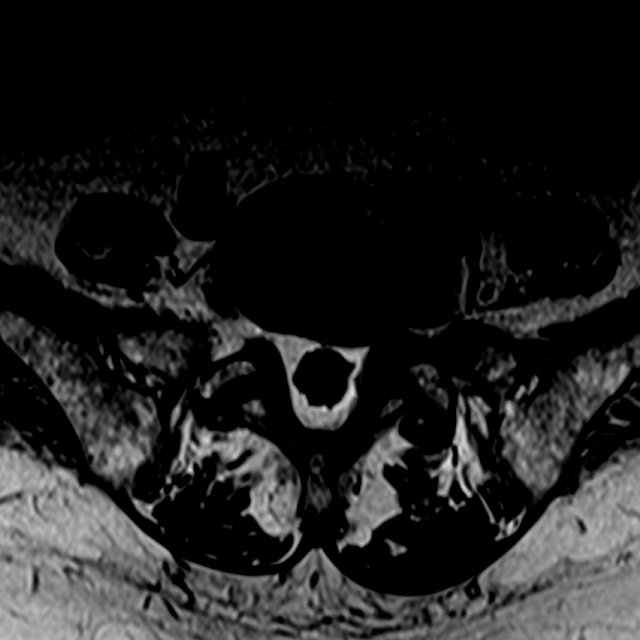
[im 19/36]
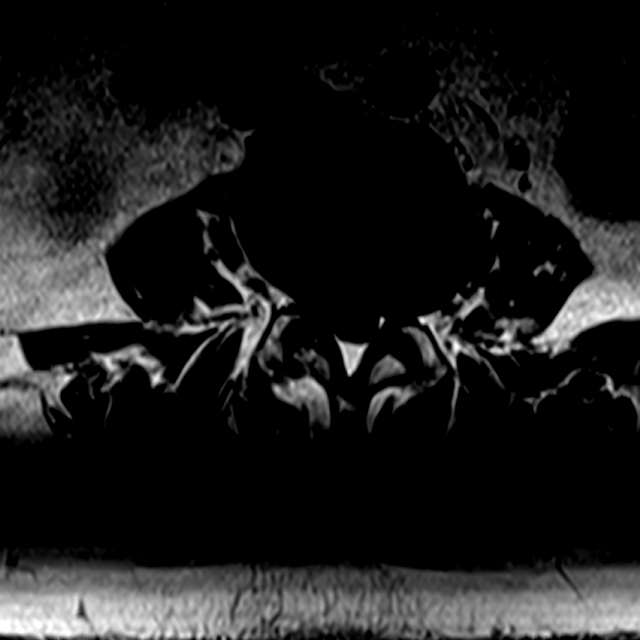
[im 30/36]
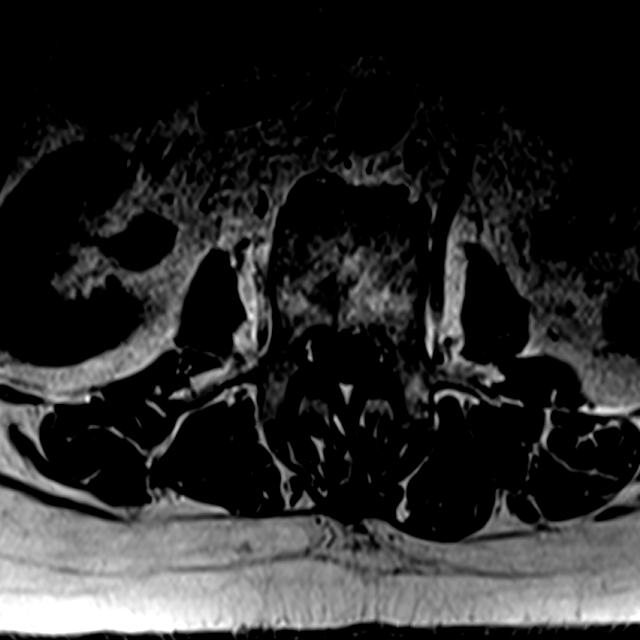

[Series 10: T2 · axial · 4.0mm · 0.25mm/px · z∈[-4,+114]mm · 3 of 36 slices shown (2 of 2)]
[im 6/36]
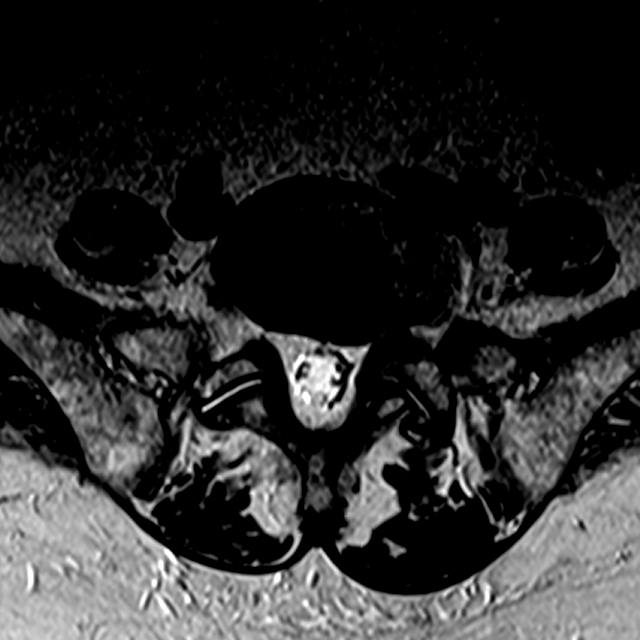
[im 19/36]
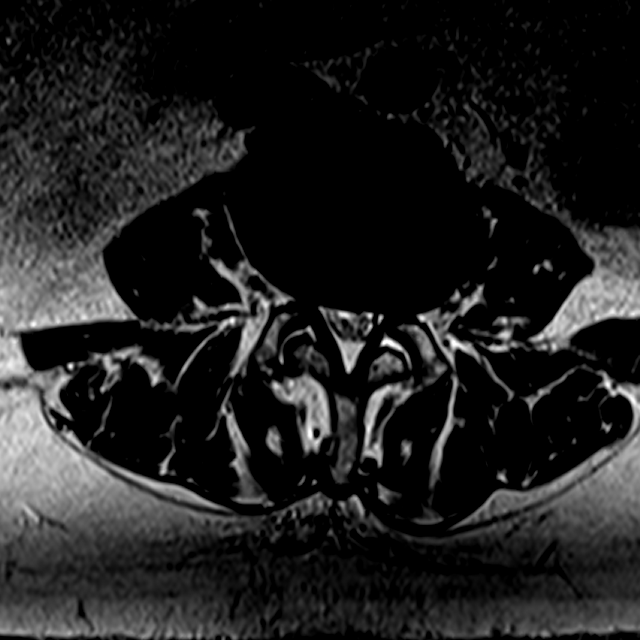
[im 30/36]
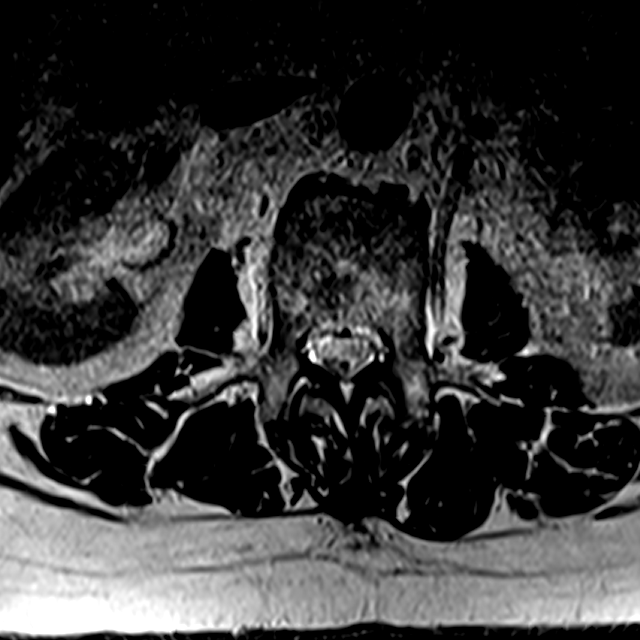

[13 of 48 positions shown; findings below may reference images not displayed]

FINDINGS: Segmentation:  Standard.

Alignment:  1-2 mm retrolisthesis of L2 on L3.

Vertebrae: No fracture, evidence of discitis, or bone lesion.
Schmorl's node along the superior endplate of T12, inferior endplate
of T12, superior endplate of L1, inferior endplate of L1, superior
endplate of L2, inferior endplate of L2, and superior endplate of
L3.

Conus medullaris and cauda equina: Conus extends to the L1-2 level.
Conus and cauda equina appear normal.

Paraspinal and other soft tissues: No acute paraspinal abnormality.

Disc levels:

Disc spaces: Degenerative disease with disc height loss throughout
the lumbar spine.

T12-L1: Minimal broad-based disc bulge. No evidence of neural
foraminal stenosis. No central canal stenosis.

L1-L2: Mild broad-based disc bulge flattening the ventral thecal
sac. Mild bilateral facet arthropathy. Mild spinal stenosis. No
evidence of neural foraminal stenosis.

L2-L3: Broad-based disc bulge flattening ventral thecal sac. Mild
bilateral facet arthropathy. Severe spinal stenosis. No evidence of
neural foraminal stenosis.

L3-L4: Broad-based disc bulge. Mild bilateral facet arthropathy.
Mild spinal stenosis. Bilateral subarticular recess stenosis. No
evidence of neural foraminal stenosis. No central canal stenosis.

L4-L5: Broad-based disc bulge. Moderate left and mild right facet
arthropathy. Bilateral subarticular recess stenosis. No evidence of
neural foraminal stenosis. No central canal stenosis.

L5-S1: Broad-based disc bulge. Moderate left and mild right facet
arthropathy. Mild left foraminal stenosis. No right foraminal
stenosis. Left subarticular recess stenosis. No evidence of neural
foraminal stenosis. No central canal stenosis.
IMPRESSION: 1. Diffuse lumbar spine spondylosis as described above. There is
interval progression of spondylosis compared with the prior
examination of 09/17/2010 particularly at L2-3.

## 2021-08-19 DIAGNOSIS — E782 Mixed hyperlipidemia: Secondary | ICD-10-CM | POA: Diagnosis not present

## 2021-08-19 DIAGNOSIS — E1165 Type 2 diabetes mellitus with hyperglycemia: Secondary | ICD-10-CM | POA: Diagnosis not present

## 2021-08-21 DIAGNOSIS — K219 Gastro-esophageal reflux disease without esophagitis: Secondary | ICD-10-CM | POA: Diagnosis not present

## 2021-08-21 DIAGNOSIS — Z853 Personal history of malignant neoplasm of breast: Secondary | ICD-10-CM | POA: Diagnosis not present

## 2021-08-21 DIAGNOSIS — E1165 Type 2 diabetes mellitus with hyperglycemia: Secondary | ICD-10-CM | POA: Diagnosis not present

## 2021-08-21 DIAGNOSIS — H6121 Impacted cerumen, right ear: Secondary | ICD-10-CM | POA: Diagnosis not present

## 2021-08-21 DIAGNOSIS — I10 Essential (primary) hypertension: Secondary | ICD-10-CM | POA: Diagnosis not present

## 2021-08-21 DIAGNOSIS — E782 Mixed hyperlipidemia: Secondary | ICD-10-CM | POA: Diagnosis not present

## 2021-08-21 DIAGNOSIS — K719 Toxic liver disease, unspecified: Secondary | ICD-10-CM | POA: Diagnosis not present

## 2021-08-21 DIAGNOSIS — M791 Myalgia, unspecified site: Secondary | ICD-10-CM | POA: Diagnosis not present

## 2021-08-21 DIAGNOSIS — G130 Paraneoplastic neuromyopathy and neuropathy: Secondary | ICD-10-CM | POA: Diagnosis not present

## 2021-08-21 DIAGNOSIS — M130 Polyarthritis, unspecified: Secondary | ICD-10-CM | POA: Diagnosis not present

## 2021-08-21 DIAGNOSIS — K580 Irritable bowel syndrome with diarrhea: Secondary | ICD-10-CM | POA: Diagnosis not present

## 2021-08-21 DIAGNOSIS — R945 Abnormal results of liver function studies: Secondary | ICD-10-CM | POA: Diagnosis not present

## 2021-11-27 DIAGNOSIS — E782 Mixed hyperlipidemia: Secondary | ICD-10-CM | POA: Diagnosis not present

## 2021-11-27 DIAGNOSIS — E1165 Type 2 diabetes mellitus with hyperglycemia: Secondary | ICD-10-CM | POA: Diagnosis not present

## 2021-12-02 DIAGNOSIS — G130 Paraneoplastic neuromyopathy and neuropathy: Secondary | ICD-10-CM | POA: Diagnosis not present

## 2021-12-02 DIAGNOSIS — E782 Mixed hyperlipidemia: Secondary | ICD-10-CM | POA: Diagnosis not present

## 2021-12-02 DIAGNOSIS — R945 Abnormal results of liver function studies: Secondary | ICD-10-CM | POA: Insufficient documentation

## 2021-12-02 DIAGNOSIS — K219 Gastro-esophageal reflux disease without esophagitis: Secondary | ICD-10-CM | POA: Diagnosis not present

## 2021-12-02 DIAGNOSIS — B3732 Chronic candidiasis of vulva and vagina: Secondary | ICD-10-CM | POA: Diagnosis not present

## 2021-12-02 DIAGNOSIS — Z853 Personal history of malignant neoplasm of breast: Secondary | ICD-10-CM | POA: Diagnosis not present

## 2021-12-02 DIAGNOSIS — D499 Neoplasm of unspecified behavior of unspecified site: Secondary | ICD-10-CM | POA: Insufficient documentation

## 2021-12-02 DIAGNOSIS — K719 Toxic liver disease, unspecified: Secondary | ICD-10-CM | POA: Diagnosis not present

## 2021-12-02 DIAGNOSIS — I10 Essential (primary) hypertension: Secondary | ICD-10-CM | POA: Diagnosis not present

## 2021-12-02 DIAGNOSIS — M791 Myalgia, unspecified site: Secondary | ICD-10-CM | POA: Diagnosis not present

## 2021-12-02 DIAGNOSIS — K580 Irritable bowel syndrome with diarrhea: Secondary | ICD-10-CM | POA: Diagnosis not present

## 2021-12-02 DIAGNOSIS — R809 Proteinuria, unspecified: Secondary | ICD-10-CM | POA: Insufficient documentation

## 2021-12-02 DIAGNOSIS — E1165 Type 2 diabetes mellitus with hyperglycemia: Secondary | ICD-10-CM | POA: Diagnosis not present

## 2021-12-02 DIAGNOSIS — M542 Cervicalgia: Secondary | ICD-10-CM | POA: Insufficient documentation

## 2021-12-02 DIAGNOSIS — M130 Polyarthritis, unspecified: Secondary | ICD-10-CM | POA: Diagnosis not present

## 2022-01-20 ENCOUNTER — Ambulatory Visit: Payer: PPO | Admitting: Diagnostic Neuroimaging

## 2022-02-03 ENCOUNTER — Ambulatory Visit: Payer: PPO | Admitting: Neurology

## 2022-02-03 ENCOUNTER — Encounter: Payer: Self-pay | Admitting: Neurology

## 2022-02-03 VITALS — BP 161/86 | HR 87 | Ht 62.0 in | Wt 211.0 lb

## 2022-02-03 DIAGNOSIS — R29898 Other symptoms and signs involving the musculoskeletal system: Secondary | ICD-10-CM | POA: Insufficient documentation

## 2022-02-03 DIAGNOSIS — R208 Other disturbances of skin sensation: Secondary | ICD-10-CM | POA: Diagnosis not present

## 2022-02-03 DIAGNOSIS — M5481 Occipital neuralgia: Secondary | ICD-10-CM | POA: Diagnosis not present

## 2022-02-03 DIAGNOSIS — R202 Paresthesia of skin: Secondary | ICD-10-CM

## 2022-02-03 DIAGNOSIS — R200 Anesthesia of skin: Secondary | ICD-10-CM

## 2022-02-03 MED ORDER — WRIST BRACE MISC: 2.0000 | Freq: Every evening | Status: DC

## 2022-02-03 NOTE — Progress Notes (Addendum)
? ? ? ?Provider:  Larey Seat, M D  ?Referring Provider: Celene Squibb, MD ?Primary Care Physician:  Celene Squibb, MD ? ?Chief Complaint  ?Patient presents with  ? New Patient (Initial Visit)  ?  Rm 10, alone. Pt referred by Dr. Celene Squibb for multiple cause neuropathy. Pt has had years of neuropathy pn in legs. She is now having numbness/tingling in L index finger that is constant all day. Numbness worse at night in both hands.  ?Intermittent L side neck pn. Last imaging in 2008 showed twisted Carotid artery on L side.   ? ? ?HPI:  Lauren Sullivan is a 71 y.o. female  Is seen here as a referral from Dr. Nevada Crane for evaluation of hand neuropathy,  ?She has neck pain some days of the month,  left sided pain, a contraction feeling.  ?She has hand pain,  onset about 3 months ago. Right hand 3 1/4  fingers go numb, waxing and waning-  left hand profoundly numb in 3 fingers, and are staying numb.  ?Hand and finger joints-Arthritis changes are noted.  ? ? ?This patient was seen for neck and headaches in 2008 for chemotherapy related changes , including cognitive changes and neuropathy.  ? ?[facility] radiology provided [place] for carotid artery, MRI neck and NCV and EMG were done in 2015 by [facility], " Message from Donzetta Starch, MD sent at 09/07/2014 1:35 PM EST ----- ?Because there is no cervical radiculopathy was found on the EMG, although she has some degenerative changes on imaging, I do not recommend cervical ESI due to the risks outweigh the benefits (include but are not limited to bleeding, infection, allergic reaction, nerve/spinal cord injuries, stroke, loss of vision and paralysis). She has neck and upper back myofascial pain and should continue the treatments I discussed with her in clinic." ? ?Medical history : Lauren Sullivan medical history was not completed in epic and I like to add here that she has been treated for type 2 diabetes mellitus probably with some lower extremity peripheral neuropathy  complication.hearing loss , progressive.   Mixed hyperlipidemia initially treated with statins, she did not tolerate the statins well.  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  Polyarthritis associated with associated with another disorder in her case of osteoarthritis.  Neck pain cervicalgia which seems to be more of a spasm and also left sided paraspinal here I think it may be an occipital neuralgia, she has a history of proteinuria first mentioned in February 2023, and a history of elevated liver function tests leading to the diagnosis of fatty liver.  Hyperglycemia due to her due to diabetes 2 her last hemoglobin A1c in this record was 7.3.  Albumin in urine was 23.4 creatinine urine 83.7.  Cholesterol total 186 triglycerides 92, LDL cholesterol 120 HDL cholesterol 49, fasting glucose 134, BUN 14 creatinine 1.65 which is excellent, AST and ALT were 29 and 26.  Albumin 4.4, CBC with differential her white blood cell count count was 6.6 her red blood cell count was 5.08 her hemoglobin 14.2 and hematocrit 44.7. ? ? ?Review of Systems: ?Out of a complete 14 system review, the patient complains of only the following symptoms, and all other reviewed systems are negative. ? Nec pain, often on the left, hand numbness 3 fingers Carpal tunnel / on the right, intermittent hand and finger numbness on the  right hand.  ? ?Social History  ? ?Socioeconomic History  ? Marital status: Married  ?  Spouse name: Gwyndolyn Saxon  ? Number of children: 2  ? Years of education: Not on file  ? Highest education level: Some college, no degree  ?Occupational History  ? Occupation: part time Network engineer  ?Tobacco Use  ? Smoking status: Former  ? Smokeless tobacco: Never  ?Vaping Use  ? Vaping Use: Never used  ?Substance and Sexual Activity  ? Alcohol use: Never  ? Drug use: Never  ? Sexual activity: Yes   ?Other Topics Concern  ? Not on file  ?Social History Narrative  ? Lives with husband  ? R handed  ? Caffeine: 1 diet soda a day  ? ?Social Determinants of Health  ? ?Financial Resource Strain: Not on file  ?Food Insecurity: Not on file  ?Transportation Needs: Not on file  ?Physical Activity: Not on file  ?Stress: Not on file  ?Social Connections: Not on file  ?Intimate Partner Violence: Not on file  ? ? ?Family History  ?Problem Relation Age of Onset  ? Cancer Mother   ?     pancreatic  ? Heart disease Mother   ? Hypertension Mother   ? Hypertension Father   ? Colon cancer Neg Hx   ? ? ?Past Medical History:  ?Diagnosis Date  ? Arthritis, osteo   ? Blood transfusion without reported diagnosis   ? Breast CA ([place]) 1994- chemotherapy and radiation, mastectomy  right breast.    ? Cataract bilateral , removed.    ? Diabetes mellitus 2,  ([place])   ? GERD (gastroesophageal reflux disease)   ? Hypertension   ? ? ?Past Surgical History:  ?Procedure Laterality Date  ? BIOPSY  01/02/2020  ? Procedure: BIOPSY;  Surgeon: Daneil Dolin, MD;  Location: [facility];  Service: Endoscopy;;  colon  ? BREAST SURGERY    ? CHOLECYSTECTOMY    ? COLONOSCOPY  2008  ? Dr. Gala Romney: Minimally friable anal canal, patient only boggy edematous terminal ileum.  Biopsies from the terminal ileum were benign.  Random colon biopsies benign.  No evidence of microscopic colitis or inflammatory bowel disease.  ? COLONOSCOPY WITH PROPOFOL N/A 01/02/2020  ? Rourk: 2 tubular adenomas removed, random colon biopsies negative.  Next colonoscopy 5 years.  ? EYE SURGERY    ? POLYPECTOMY  01/02/2020  ? Procedure: POLYPECTOMY;  Surgeon: Daneil Dolin, MD;  Location: [facility];  Service: Endoscopy;;  ? TUBAL LIGATION    ? ? ?Current Outpatient Medications  ?Medication Sig Dispense Refill  ? acetaminophen (TYLENOL) 500 MG tablet Take 1,000 mg by mouth every 6 (six) hours as needed for headache.    ? amLODipine (NORVASC) 5 MG tablet Take 5 mg by mouth daily.     ? fluconazole (DIFLUCAN) 150 MG tablet Take 150 mg by mouth as needed (Yeast infection).     ? metFORMIN (GLUCOPHAGE-XR) 500 MG 24 hr tablet Take 500 mg by mouth 2 (two) times daily.    ? metoprolol tartrate (LOPRESSOR) 100 MG tablet Take 1 tablet (100 mg total) by mouth 2 (two) times daily. 60 tablet 3  ? naproxen (NAPROSYN) 500 MG tablet Take 500 mg by mouth daily.     ? nortriptyline (PAMELOR) 50 MG capsule Take 50 mg by mouth daily.     ? omeprazole (PRILOSEC) 20 MG capsule Take 20 mg by mouth daily.    ? predniSONE (DELTASONE) 10 MG tablet Take 10 mg  by mouth as needed (Back pain).     ? ?No current facility-administered medications for this visit.  ? ? ?Allergies as of 02/03/2022 - Review Complete 02/03/2022  ?Allergen Reaction Noted  ? Tape Dermatitis 01/02/2020  ? Vancomycin Itching and Rash 01/27/2011  ? ? ?Vitals: ?BP (!) 161/86   Pulse 87   Ht '5\' 2"'$  (1.575 m)   Wt 211 lb (95.7 kg)   BMI 38.59 kg/m?  ?Last Weight:  ?Wt Readings from Last 1 Encounters:  ?02/03/22 211 lb (95.7 kg)  ? ?Last Height:   ?Ht Readings from Last 1 Encounters:  ?02/03/22 '5\' 2"'$  (1.575 m)  ? ? ?Physical exam: ? ?General: The patient is awake, alert and appears not in acute distress. The patient is well groomed. ?Head: Normocephalic, atraumatic. Neck is tender- occipital , paraspinal tenderness, retroauricular- . ?Left side   ?Mallampati 2, neck circumference:16" ?Cardiovascular:  Regular rate and rhythm, without  murmurs or carotid bruit, and without distended neck veins. ?Respiratory: Lungs are clear to auscultation. ?Skin:  Without evidence of edema, or rash ?Trunk: BMI is 38 , normal posture. ? ?Neurologic exam : ?The patient is awake and alert, oriented to place and time.  Memory subjective  described as intact. There is a normal attention span & concentration ability. Speech is fluent without  dysarthria, dysphonia or aphasia. Mood and affect are appropriate. ? ?Cranial nerves: ?Pupils are equal and briskly reactive to  light. Funduscopic exam deferred. edema. Extraocular movements  in vertical and horizontal planes intact and without nystagmus.  ?Visual fields by finger perimetry are intact. ?Hearing impaired .  Facial sensation intact to f

## 2022-02-03 NOTE — Patient Instructions (Signed)
Electromyoneurogram ?Electromyoneurogram is a test to check how well your muscles and nerves are working. This procedure includes the combined use of electromyogram (EMG) and nerve conduction study (NCS). EMG is used to evaluate muscles and the nerves that control those muscles. NCS, which is also called electroneurogram, measures how well your nerves conduct electricity. ?The procedures should be done together to check if your muscles and nerves are healthy. If the results of the tests are abnormal, this may indicate disease or injury, such as a neuromuscular disease or peripheral nerve damage. ?Tell a health care provider about: ?Any allergies you have. ?All medicines you are taking, including vitamins, herbs, eye drops, creams, and over-the-counter medicines. ?Any bleeding problems you have. ?Any surgeries you have had. ?Any medical conditions you have. ?What are the risks? ?Generally, this is a safe procedure. However, problems may occur, including: ?Bleeding or bruising. ?Infection where the electrodes were inserted. ?What happens before the test? ?Medicines ?Take all of your usually prescribed medications before this testing is performed. Do not stop your blood thinners unless advised by your prescribing physician. ?General instructions ?Your health care provider may ask you to warm the limb that will be checked with warm water, hot pack, or wrapping the limb in a blanket. ?Do not use lotions or creams on the same day that you will be having the procedure. ?What happens during the test? ?For EMG ? ?Your health care provider will ask you to stay in a position so that the muscle being studied can be accessed. You will be sitting or lying down. ?You may be given a medicine to numb the area (local anesthetic) and the skin will be disinfected. ?A very thin needle that has an electrode will be inserted into your muscle, one muscle at a time. Typically, multiple muscles are evaluated during a single study. ?Another  small electrode will be placed on your skin near the muscle. ?Your health care provider will ask you to continue to remain still. ?The electrodes will record the electrical activity of your muscles. You may see this on a monitor or hear it in the room. ?After your muscles have been studied at rest, your health care provider will ask you to contract or flex your muscles. The electrodes will record the electrical activity of your muscles. ?Your health care provider will remove the electrodes and the electrode needle when the procedure is finished. ?The procedure may vary among health care providers and hospitals. ?For NCS ? ?An electrode that records your nerve activity (recording electrode) will be placed on your skin by the muscle that is being studied. ?An electrode that is used as a reference (reference electrode) will be placed near the recording electrode. ?A paste or gel will be applied to your skin between the recording electrode and the reference electrode. ?Your nerve will be stimulated with a mild shock. The speed of the nerves and strength of response is recorded by the electrodes. ?Your health care provider will remove the electrodes and the gel when the procedure is finished. ?The procedure may vary among health care providers and hospitals. ?What can I expect after the test? ?It is up to you to get your test results. Ask your health care provider, or the department that is doing the test, when your results will be ready. ?Your health care provider may: ?Give you medicines for any pain. ?Monitor the insertion sites to make sure that bleeding stops. ?You should be able to drive yourself to and from the test. ?Discomfort can  persist for a few hours after the test, but should be better the next day. ?Contact a health care provider if: ?You have swelling, redness, or drainage at any of the insertion sites. ?Summary ?Electromyoneurogram is a test to check how well your muscles and nerves are working. ?If the  results of the tests are abnormal, this may indicate disease or injury. ?This is a safe procedure. However, problems may occur, such as bleeding and infection. ?Your health care provider will do two tests to complete this procedure. One checks your muscles (EMG) and another checks your nerves (NCS). ?It is up to you to get your test results. Ask your health care provider, or the department that is doing the test, when your results will be ready. ?This information is not intended to replace advice given to you by your health care provider. Make sure you discuss any questions you have with your health care provider. ?Document Revised: 06/19/2021 Document Reviewed: 05/19/2021 ?Elsevier Patient Education ? [place]. ?Occipital Neuralgia ? ?Occipital neuralgia is a type of headache that causes brief episodes of very bad pain in the back of the head. Pain from occipital neuralgia may spread (radiate) to other parts of the head. ?These headaches may be caused by irritation of the nerves that leave the spinal cord high up in the neck, just below the base of the skull (occipital nerves). The occipital nerves transmit sensations from the back of the head, the top of the head, and the areas behind the ears. ?What are the causes? ?This condition can occur without any known cause (primary headache syndrome). In other cases, this condition is caused by pressure on or irritation of one of the two occipital nerves. Pressure and irritation may be due to: ?Muscle spasm in the neck. ?Neck injury. ?Wear and tear of the vertebrae in the neck (osteoarthritis). ?Disease of the disks that separate the vertebrae. ?Swollen blood vessels that put pressure on the occipital nerves. ?Infections. ?Tumors. ?Diabetes. ?What are the signs or symptoms? ?This condition causes brief burning, stabbing, electric, shocking, or shooting pain in the back of the head that can radiate to the top of the head. It can happen on one side or both sides of  the head. It can also cause: ?Pain behind the eye. ?Pain triggered by neck movement or hair brushing. ?Scalp tenderness. ?Aching in the back of the head between episodes of very bad pain. ?Pain that gets worse with exposure to bright lights. ?How is this diagnosed? ?Your health care provider may diagnose the condition based on a physical exam and your symptoms. Tests may be done, such as: ?Imaging studies of the brain and neck (cervical spine), such as an MRI or CT scan. These look for causes of pinched nerves. ?Applying pressure to the nerves in the neck to try to re-create the pain. ?Injection of numbing medicine into the occipital nerve areas to see if pain goes away (diagnostic nerve block). ?How is this treated? ?Treatment for this condition may begin with simple measures, such as: ?Rest. ?Massage. ?Applying heat or cold to the area. ?Over-the-counter pain relievers. ?If these measures do not work, you may need other treatments, including: ?Medicines, such as: ?Prescription-strength anti-inflammatory medicines. ?Muscle relaxants. ?Anti-seizure medicines, which can relieve pain. ?Antidepressants, which can relieve pain. ?Injected medicines, such as medicines that numb the area (local anesthetic) and steroids. ?Pulsed radiofrequency ablation. This is when wires are implanted to deliver electrical impulses that block pain signals from the occipital nerve. ?Surgery to relieve nerve  pressure. ?Physical therapy. ?Follow these instructions at home: ?Managing pain ? ?  ? ?Avoid any activities that cause pain. ?Rest when you have an attack of pain. ?Try gentle massage to relieve pain. ?Try a different pillow or sleeping position. ?If directed, apply heat to the affected area as often as told by your health care provider. Use the heat source that your health care provider recommends, such as a moist heat pack or a heating pad. ?Place a towel between your skin and the heat source. ?Leave the heat on for 20-30  minutes. ?Remove the heat if your skin turns bright red. This is especially important if you are unable to feel pain, heat, or cold. You have a greater risk of getting burned. ?If directed, put ice on the back of

## 2022-03-21 ENCOUNTER — Ambulatory Visit
Admission: EM | Admit: 2022-03-21 | Discharge: 2022-03-21 | Disposition: A | Discharge status: Home / Self Care | Payer: PPO | Attending: Family Medicine | Admitting: Family Medicine

## 2022-03-21 DIAGNOSIS — J069 Acute upper respiratory infection, unspecified: Secondary | ICD-10-CM

## 2022-03-21 MED ORDER — AZELASTINE HCL 0.1 % NA SOLN: 1.0000 | Freq: Two times a day (BID) | NASAL | 1 refills | Status: DC

## 2022-03-21 MED ORDER — PROMETHAZINE-DM 6.25-15 MG/5ML PO SYRP: 5.0000 mL | ORAL_SOLUTION | Freq: Four times a day (QID) | ORAL | 0 refills | Status: DC | PRN

## 2022-03-21 MED ORDER — IBUPROFEN 600 MG PO TABS: 600.0000 mg | ORAL_TABLET | Freq: Four times a day (QID) | ORAL | 0 refills | Status: DC | PRN

## 2022-03-21 NOTE — ED Provider Notes (Signed)
[facility]    CSN: [id number] Arrival date & time: 03/21/22  1804      History   Chief Complaint Chief Complaint  Patient presents with   Cough    Dizziness, cough and headache    HPI CHERYE GAERTNER is a 71 y.o. female.   Presenting today with 2-day history of sinus headache, sore throat, room spinning dizziness, ear pressure, cough, congestion.  Denies fever, chills, body aches, chest pain, shortness of breath, abdominal pain, nausea vomiting or diarrhea.  Taking Mucinex sinus which is helping her symptoms mildly.  Multiple sick contacts recently.   Past Medical History:  Diagnosis Date   Arthritis    Blood transfusion without reported diagnosis    Breast CA ([place])    Cataract    Diabetes mellitus without complication ([place])    GERD (gastroesophageal reflux disease)    Hypertension     Patient Active Problem List   Diagnosis Date Noted   Decreased grip strength 02/03/2022   Cervico-occipital neuralgia of left side 02/03/2022   Complaining of cold hands 02/03/2022   Numbness and tingling in both hands 02/03/2022   Fatty liver 04/11/2020   Left sided abdominal pain 11/21/2019   Rectal bleeding 11/21/2019   Menopause 11/07/2019   Encounter for screening mammogram for malignant neoplasm of breast 11/07/2019   Diarrhea 11/07/2019   Hematochezia 11/07/2019   Controlled type 2 diabetes mellitus without complication, without long-term current use of insulin ([place]) 11/07/2019   Breast CA ([place])    BREAST CANCER 10/16/2009   MIGRAINE HEADACHE 10/16/2009   NEUROPATHY 10/16/2009   Hypertension complicating diabetes ([place]) 10/16/2009   GERD 10/16/2009   Arthritis of both hands 10/16/2009    Past Surgical History:  Procedure Laterality Date   BIOPSY  01/02/2020   Procedure: BIOPSY;  Surgeon: Daneil Dolin, MD;  [facility];  Service: Endoscopy;;  colon   BREAST SURGERY     CHOLECYSTECTOMY     COLONOSCOPY  2008   Dr. Gala Romney: Minimally friable anal  canal, patient only boggy edematous terminal ileum.  Biopsies from the terminal ileum were benign.  Random colon biopsies benign.  No evidence of microscopic colitis or inflammatory bowel disease.   COLONOSCOPY WITH PROPOFOL N/A 01/02/2020   R[place]k: 2 tubular adenomas removed, random colon biopsies negative.  Next colonoscopy 5 years.   EYE SURGERY     POLYPECTOMY  01/02/2020   Procedure: POLYPECTOMY;  Surgeon: Daneil Dolin, MD;  [facility];  Service: Endoscopy;;   TUBAL LIGATION      OB History   No obstetric history on file.      Home Medications    Prior to Admission medications   Medication Sig Start Date End Date Taking? Authorizing Provider  azelastine (ASTELIN) 0.1 % nasal spray Place 1 spray into both nostrils 2 (two) times daily. Use in each nostril as directed 03/21/22  Yes Volney American, PA-C  ibuprofen (ADVIL) 600 MG tablet Take 1 tablet (600 mg total) by mouth every 6 (six) hours as needed. 03/21/22  Yes Volney American, PA-C  promethazine-dextromethorphan (PROMETHAZINE-DM) 6.25-15 MG/5ML syrup Take 5 mLs by mouth 4 (four) times daily as needed. 03/21/22  Yes Volney American, PA-C  acetaminophen (TYLENOL) 500 MG tablet Take 1,000 mg by mouth every 6 (six) hours as needed for headache.    [provider]  amLODipine (NORVASC) 5 MG tablet Take 5 mg by mouth daily. 01/27/22   [provider]  fluconazole (DIFLUCAN) 150 MG tablet Take 150 mg by mouth as needed (Yeast infection).  12/19/19   [provider]  metFORMIN (GLUCOPHAGE-XR) 500 MG 24 hr tablet Take 500 mg by mouth 2 (two) times daily. 01/27/22   [provider]  metoprolol tartrate (LOPRESSOR) 100 MG tablet Take 1 tablet (100 mg total) by mouth 2 (two) times daily. 01/18/20   Corum, Rex Kras, MD  Misc. Devices (WRIST BRACE) MISC 2 Pieces by Does not apply route at bedtime. 02/03/22   Dohmeier, Asencion Partridge, MD  naproxen (NAPROSYN) 500 MG tablet Take 500 mg by mouth  daily.     [provider]  nortriptyline (PAMELOR) 50 MG capsule Take 50 mg by mouth daily.     [provider]  omeprazole (PRILOSEC) 20 MG capsule Take 20 mg by mouth daily.    [provider]  predniSONE (DELTASONE) 10 MG tablet Take 10 mg by mouth as needed (Back pain).  12/19/19   [provider]    Family History Family History  Problem Relation Age of Onset   Cancer Mother        pancreatic   Heart disease Mother    Hypertension Mother    Hypertension Father    Colon cancer Neg Hx     Social History Social History   Tobacco Use   Smoking status: Former    Passive exposure: Never   Smokeless tobacco: Never  Vaping Use   Vaping Use: Never used  Substance Use Topics   Alcohol use: Never   Drug use: Never     Allergies   Tape and Vancomycin   Review of Systems Review of Systems Per HPI  Physical Exam Triage Vital Signs ED Triage Vitals  Enc Vitals Group     BP 03/21/22 1827 (!) 142/75     Pulse Rate 03/21/22 1827 77     Resp 03/21/22 1827 18     Temp 03/21/22 1827 98.3 F (36.8 C)     Temp Source 03/21/22 1827 Oral     SpO2 03/21/22 1827 92 %     Weight --      Height --      Head Circumference --      Peak Flow --      Pain Score 03/21/22 1829 10     Pain Loc --      Pain Edu? --      Excl. in [place]? --    No data found.  Updated Vital Signs BP (!) 142/75 (BP Location: Left Arm)   Pulse 77   Temp 98.3 F (36.8 C) (Oral)   Resp 18   SpO2 92%   Visual Acuity Right Eye Distance:   Left Eye Distance:   Bilateral Distance:    Right Eye Near:   Left Eye Near:    Bilateral Near:     Physical Exam Vitals and nursing note reviewed.  Constitutional:      Appearance: Normal appearance. She is not ill-appearing.  HENT:     Head: Atraumatic.     Right Ear: Tympanic membrane and external ear normal.     Left Ear: Tympanic membrane and external ear normal.     Nose: Rhinorrhea present.     Mouth/Throat:      Mouth: Mucous membranes are moist.     Pharynx: Posterior oropharyngeal erythema present.  Eyes:     Extraocular Movements: Extraocular movements intact.     Conjunctiva/sclera: Conjunctivae normal.  Cardiovascular:  Rate and Rhythm: Normal rate and regular rhythm.     Heart sounds: Normal heart sounds.  Pulmonary:     Effort: Pulmonary effort is normal.     Breath sounds: Normal breath sounds. No wheezing or rales.  Musculoskeletal:        General: Normal range of motion.     Cervical back: Normal range of motion and neck supple.  Skin:    General: Skin is warm and dry.  Neurological:     Mental Status: She is alert and oriented to person, place, and time.  Psychiatric:        Mood and Affect: Mood normal.        Thought Content: Thought content normal.        Judgment: Judgment normal.     UC Treatments / Results  Labs (all labs ordered are listed, but only abnormal results are displayed) Labs Reviewed - No data to display  EKG   Radiology No results found.  Procedures Procedures (including critical care time)  Medications Ordered in UC Medications - No data to display  Initial Impression / Assessment and Plan / UC Course  I have reviewed the triage vital signs and the nursing notes.  Pertinent labs & imaging results that were available during my care of the patient were reviewed by me and considered in my medical decision making (see chart for details).     Suspect viral upper respiratory infection, treat with Astelin nasal spray, Phenergan DM, continued over-the-counter cold and congestion medications and supportive care.  Return for worsening symptoms.  Final Clinical Impressions(s) / UC Diagnoses   Final diagnoses:  Viral URI with cough   Discharge Instructions   None    ED Prescriptions     Medication Sig Dispense Auth. Provider   azelastine (ASTELIN) 0.1 % nasal spray Place 1 spray into both nostrils 2 (two) times daily. Use in each nostril as  directed 30 mL Volney American, PA-C   promethazine-dextromethorphan (PROMETHAZINE-DM) 6.25-15 MG/5ML syrup Take 5 mLs by mouth 4 (four) times daily as needed. 100 mL Volney American, [place]   ibuprofen (ADVIL) 600 MG tablet Take 1 tablet (600 mg total) by mouth every 6 (six) hours as needed. 30 tablet Volney American, [place]      PDMP not reviewed this encounter.   Volney American, [place] 03/21/22 1926

## 2022-03-21 NOTE — ED Triage Notes (Signed)
Pt states that on Wednesday she started with bad headache, scratchy throat, dizziness and nausea  Pt states she has tried Mucinex Sinus and it is nit helping her headache and facial pain  Denies Fever

## 2022-05-05 ENCOUNTER — Ambulatory Visit: Payer: PPO | Admitting: Neurology

## 2022-05-05 DIAGNOSIS — E782 Mixed hyperlipidemia: Secondary | ICD-10-CM | POA: Diagnosis not present

## 2022-05-05 DIAGNOSIS — E1165 Type 2 diabetes mellitus with hyperglycemia: Secondary | ICD-10-CM | POA: Diagnosis not present

## 2022-05-12 DIAGNOSIS — Z853 Personal history of malignant neoplasm of breast: Secondary | ICD-10-CM | POA: Diagnosis not present

## 2022-05-12 DIAGNOSIS — E782 Mixed hyperlipidemia: Secondary | ICD-10-CM | POA: Diagnosis not present

## 2022-05-12 DIAGNOSIS — G720 Drug-induced myopathy: Secondary | ICD-10-CM | POA: Diagnosis not present

## 2022-05-12 DIAGNOSIS — M791 Myalgia, unspecified site: Secondary | ICD-10-CM | POA: Insufficient documentation

## 2022-05-12 DIAGNOSIS — T466X5A Adverse effect of antihyperlipidemic and antiarteriosclerotic drugs, initial encounter: Secondary | ICD-10-CM | POA: Insufficient documentation

## 2022-05-12 DIAGNOSIS — I10 Essential (primary) hypertension: Secondary | ICD-10-CM | POA: Diagnosis not present

## 2022-05-12 DIAGNOSIS — K719 Toxic liver disease, unspecified: Secondary | ICD-10-CM | POA: Diagnosis not present

## 2022-05-12 DIAGNOSIS — M130 Polyarthritis, unspecified: Secondary | ICD-10-CM | POA: Diagnosis not present

## 2022-05-12 DIAGNOSIS — G130 Paraneoplastic neuromyopathy and neuropathy: Secondary | ICD-10-CM | POA: Diagnosis not present

## 2022-05-12 DIAGNOSIS — K580 Irritable bowel syndrome with diarrhea: Secondary | ICD-10-CM | POA: Diagnosis not present

## 2022-05-12 DIAGNOSIS — R945 Abnormal results of liver function studies: Secondary | ICD-10-CM | POA: Diagnosis not present

## 2022-05-12 DIAGNOSIS — K219 Gastro-esophageal reflux disease without esophagitis: Secondary | ICD-10-CM | POA: Diagnosis not present

## 2022-05-12 DIAGNOSIS — H8110 Benign paroxysmal vertigo, unspecified ear: Secondary | ICD-10-CM | POA: Insufficient documentation

## 2022-05-12 DIAGNOSIS — E1165 Type 2 diabetes mellitus with hyperglycemia: Secondary | ICD-10-CM | POA: Diagnosis not present

## 2022-05-12 DIAGNOSIS — B3732 Chronic candidiasis of vulva and vagina: Secondary | ICD-10-CM | POA: Diagnosis not present

## 2022-06-11 DIAGNOSIS — Z6831 Body mass index (BMI) 31.0-31.9, adult: Secondary | ICD-10-CM | POA: Diagnosis not present

## 2022-06-11 DIAGNOSIS — I10 Essential (primary) hypertension: Secondary | ICD-10-CM | POA: Diagnosis not present

## 2022-06-11 DIAGNOSIS — E669 Obesity, unspecified: Secondary | ICD-10-CM | POA: Diagnosis not present

## 2022-06-11 DIAGNOSIS — E1165 Type 2 diabetes mellitus with hyperglycemia: Secondary | ICD-10-CM | POA: Diagnosis not present

## 2022-06-11 DIAGNOSIS — K219 Gastro-esophageal reflux disease without esophagitis: Secondary | ICD-10-CM | POA: Diagnosis not present

## 2022-08-15 DIAGNOSIS — E1165 Type 2 diabetes mellitus with hyperglycemia: Secondary | ICD-10-CM | POA: Diagnosis not present

## 2022-08-15 DIAGNOSIS — E782 Mixed hyperlipidemia: Secondary | ICD-10-CM | POA: Diagnosis not present

## 2022-08-20 ENCOUNTER — Other Ambulatory Visit (HOSPITAL_COMMUNITY): Payer: Self-pay | Admitting: Internal Medicine

## 2022-08-20 DIAGNOSIS — G720 Drug-induced myopathy: Secondary | ICD-10-CM | POA: Diagnosis not present

## 2022-08-20 DIAGNOSIS — M130 Polyarthritis, unspecified: Secondary | ICD-10-CM | POA: Diagnosis not present

## 2022-08-20 DIAGNOSIS — G130 Paraneoplastic neuromyopathy and neuropathy: Secondary | ICD-10-CM | POA: Diagnosis not present

## 2022-08-20 DIAGNOSIS — K580 Irritable bowel syndrome with diarrhea: Secondary | ICD-10-CM | POA: Diagnosis not present

## 2022-08-20 DIAGNOSIS — K719 Toxic liver disease, unspecified: Secondary | ICD-10-CM | POA: Diagnosis not present

## 2022-08-20 DIAGNOSIS — R300 Dysuria: Secondary | ICD-10-CM | POA: Diagnosis not present

## 2022-08-20 DIAGNOSIS — Z853 Personal history of malignant neoplasm of breast: Secondary | ICD-10-CM | POA: Diagnosis not present

## 2022-08-20 DIAGNOSIS — B351 Tinea unguium: Secondary | ICD-10-CM | POA: Insufficient documentation

## 2022-08-20 DIAGNOSIS — E118 Type 2 diabetes mellitus with unspecified complications: Secondary | ICD-10-CM | POA: Insufficient documentation

## 2022-08-20 DIAGNOSIS — Z1231 Encounter for screening mammogram for malignant neoplasm of breast: Secondary | ICD-10-CM

## 2022-08-20 DIAGNOSIS — R945 Abnormal results of liver function studies: Secondary | ICD-10-CM | POA: Diagnosis not present

## 2022-08-20 DIAGNOSIS — K219 Gastro-esophageal reflux disease without esophagitis: Secondary | ICD-10-CM | POA: Diagnosis not present

## 2022-08-20 DIAGNOSIS — E782 Mixed hyperlipidemia: Secondary | ICD-10-CM | POA: Diagnosis not present

## 2022-08-20 DIAGNOSIS — I10 Essential (primary) hypertension: Secondary | ICD-10-CM | POA: Diagnosis not present

## 2022-08-21 DIAGNOSIS — R350 Frequency of micturition: Secondary | ICD-10-CM | POA: Diagnosis not present

## 2022-09-08 ENCOUNTER — Ambulatory Visit (HOSPITAL_COMMUNITY)
Admission: RE | Admit: 2022-09-08 | Discharge: 2022-09-08 | Disposition: A | Discharge status: Home / Self Care | Payer: PPO | Source: Ambulatory Visit | Attending: Internal Medicine | Admitting: Internal Medicine

## 2022-09-08 DIAGNOSIS — Z1231 Encounter for screening mammogram for malignant neoplasm of breast: Secondary | ICD-10-CM | POA: Diagnosis not present

## 2022-09-16 ENCOUNTER — Other Ambulatory Visit (HOSPITAL_COMMUNITY): Payer: Self-pay | Admitting: Internal Medicine

## 2022-09-16 DIAGNOSIS — R928 Other abnormal and inconclusive findings on diagnostic imaging of breast: Secondary | ICD-10-CM

## 2022-09-17 ENCOUNTER — Ambulatory Visit (HOSPITAL_COMMUNITY)
Admission: RE | Admit: 2022-09-17 | Discharge: 2022-09-17 | Disposition: A | Discharge status: Home / Self Care | Payer: PPO | Source: Ambulatory Visit | Attending: Internal Medicine | Admitting: Internal Medicine

## 2022-09-17 ENCOUNTER — Encounter (HOSPITAL_COMMUNITY): Payer: Self-pay

## 2022-09-17 DIAGNOSIS — R928 Other abnormal and inconclusive findings on diagnostic imaging of breast: Secondary | ICD-10-CM | POA: Diagnosis not present

## 2022-09-17 DIAGNOSIS — R921 Mammographic calcification found on diagnostic imaging of breast: Secondary | ICD-10-CM | POA: Diagnosis not present

## 2022-10-31 DIAGNOSIS — C50011 Malignant neoplasm of nipple and areola, right female breast: Secondary | ICD-10-CM | POA: Diagnosis not present

## 2022-10-31 DIAGNOSIS — Z853 Personal history of malignant neoplasm of breast: Secondary | ICD-10-CM | POA: Diagnosis not present

## 2022-11-04 ENCOUNTER — Emergency Department (HOSPITAL_COMMUNITY)
Admission: EM | Admit: 2022-11-04 | Discharge: 2022-11-04 | Disposition: A | Discharge status: Home / Self Care | Payer: PPO | Attending: Emergency Medicine | Admitting: Emergency Medicine

## 2022-11-04 ENCOUNTER — Emergency Department (HOSPITAL_COMMUNITY): Payer: PPO

## 2022-11-04 ENCOUNTER — Other Ambulatory Visit: Payer: Self-pay

## 2022-11-04 DIAGNOSIS — K5730 Diverticulosis of large intestine without perforation or abscess without bleeding: Secondary | ICD-10-CM | POA: Diagnosis not present

## 2022-11-04 DIAGNOSIS — R109 Unspecified abdominal pain: Secondary | ICD-10-CM | POA: Diagnosis present

## 2022-11-04 DIAGNOSIS — N132 Hydronephrosis with renal and ureteral calculous obstruction: Secondary | ICD-10-CM | POA: Diagnosis not present

## 2022-11-04 DIAGNOSIS — N201 Calculus of ureter: Secondary | ICD-10-CM | POA: Diagnosis not present

## 2022-11-04 DIAGNOSIS — M47816 Spondylosis without myelopathy or radiculopathy, lumbar region: Secondary | ICD-10-CM | POA: Diagnosis not present

## 2022-11-04 DIAGNOSIS — N3289 Other specified disorders of bladder: Secondary | ICD-10-CM | POA: Diagnosis not present

## 2022-11-04 LAB — URINALYSIS, ROUTINE W REFLEX MICROSCOPIC
Bilirubin Urine: NEGATIVE
Glucose, UA: 250 mg/dL — AB
Ketones, ur: NEGATIVE mg/dL
Leukocytes,Ua: NEGATIVE
Nitrite: NEGATIVE
Protein, ur: NEGATIVE mg/dL
Specific Gravity, Urine: 1.025 (ref 1.005–1.030)
pH: 6 (ref 5.0–8.0)

## 2022-11-04 LAB — CBC
HCT: 44.5 % (ref 36.0–46.0)
Hemoglobin: 14.5 g/dL (ref 12.0–15.0)
MCH: 28.9 pg (ref 26.0–34.0)
MCHC: 32.6 g/dL (ref 30.0–36.0)
MCV: 88.6 fL (ref 80.0–100.0)
Platelets: 297 10*3/uL (ref 150–400)
RBC: 5.02 MIL/uL (ref 3.87–5.11)
RDW: 14.6 % (ref 11.5–15.5)
WBC: 12.8 10*3/uL — ABNORMAL HIGH (ref 4.0–10.5)
nRBC: 0 % (ref 0.0–0.2)

## 2022-11-04 LAB — URINALYSIS, MICROSCOPIC (REFLEX): RBC / HPF: >50 RBC/hpf (ref 0–5)

## 2022-11-04 LAB — BASIC METABOLIC PANEL
Anion gap: 11 (ref 5–15)
BUN: 15 mg/dL (ref 8–23)
CO2: 25 mmol/L (ref 22–32)
Calcium: 10.3 mg/dL (ref 8.9–10.3)
Chloride: 100 mmol/L (ref 98–111)
Creatinine, Ser: 0.93 mg/dL (ref 0.44–1.00)
GFR, Estimated: >60 mL/min (ref >60)
Glucose, Bld: 160 mg/dL — ABNORMAL HIGH (ref 70–99)
Potassium: 3.8 mmol/L (ref 3.5–5.1)
Sodium: 136 mmol/L (ref 135–145)

## 2022-11-04 MED ORDER — TAMSULOSIN HCL 0.4 MG PO CAPS: 0.4000 mg | ORAL_CAPSULE | Freq: Every day | ORAL | 0 refills | Status: AC

## 2022-11-04 MED ORDER — ONDANSETRON HCL 4 MG/2ML IJ SOLN
4.0000 mg | Freq: Once | INTRAMUSCULAR | Status: AC
  Administered 2022-11-04: 4 mg via INTRAVENOUS
  Filled 2022-11-04: qty 2

## 2022-11-04 MED ORDER — NAPROXEN 500 MG PO TABS: 500.0000 mg | ORAL_TABLET | Freq: Two times a day (BID) | ORAL | 0 refills | Status: DC

## 2022-11-04 MED ORDER — HYDROCODONE-ACETAMINOPHEN 5-325 MG PO TABS: 1.0000 | ORAL_TABLET | Freq: Four times a day (QID) | ORAL | 0 refills | Status: DC | PRN

## 2022-11-04 MED ORDER — SODIUM CHLORIDE 0.9 % IV BOLUS
500.0000 mL | Freq: Once | INTRAVENOUS | Status: AC
  Administered 2022-11-04: 500 mL via INTRAVENOUS

## 2022-11-04 MED ORDER — ONDANSETRON 4 MG PO TBDP: 4.0000 mg | ORAL_TABLET | Freq: Three times a day (TID) | ORAL | 0 refills | Status: DC | PRN

## 2022-11-04 MED ORDER — FENTANYL CITRATE PF 50 MCG/ML IJ SOSY
50.0000 ug | PREFILLED_SYRINGE | Freq: Once | INTRAMUSCULAR | Status: AC
  Administered 2022-11-04: 50 ug via INTRAVENOUS
  Filled 2022-11-04: qty 1

## 2022-11-04 NOTE — ED Notes (Signed)
Patient transported to CT 

## 2022-11-04 NOTE — ED Provider Notes (Signed)
[facility] EMERGENCY DEPARTMENT  Provider Note  CSN: [id number] Arrival date & time: 11/04/22 7341  History Chief Complaint  Patient presents with   Flank Pain    Lauren Sullivan is a 72 y.o. female here for evaluation of L flank pain off and on for the last two days, radiating to her LLQ, associated with nausea but no vomiting. She has had some increased urinary frequency above baseline but less volume when she goes. No dysuria, urgency or gross hematuria. No fevers. No prior history of similar.    Home Medications Prior to Admission medications   Medication Sig Start Date End Date Taking? Authorizing Provider  HYDROcodone-acetaminophen (NORCO/VICODIN) 5-325 MG tablet Take 1 tablet by mouth every 6 (six) hours as needed for severe pain. 11/04/22  Yes Truddie Hidden, MD  naproxen (NAPROSYN) 500 MG tablet Take 1 tablet (500 mg total) by mouth 2 (two) times daily. 11/04/22  Yes Truddie Hidden, MD  ondansetron (ZOFRAN-ODT) 4 MG disintegrating tablet Take 1 tablet (4 mg total) by mouth every 8 (eight) hours as needed for nausea or vomiting. 11/04/22  Yes Truddie Hidden, MD  tamsulosin (FLOMAX) 0.4 MG CAPS capsule Take 1 capsule (0.4 mg total) by mouth daily for 14 days. 11/04/22 11/18/22 Yes Truddie Hidden, MD  acetaminophen (TYLENOL) 500 MG tablet Take 1,000 mg by mouth every 6 (six) hours as needed for headache.    [provider]  amLODipine (NORVASC) 5 MG tablet Take 5 mg by mouth daily. 01/27/22   [provider]  azelastine (ASTELIN) 0.1 % nasal spray Place 1 spray into both nostrils 2 (two) times daily. Use in each nostril as directed 03/21/22   Volney American, PA-C  fluconazole (DIFLUCAN) 150 MG tablet Take 150 mg by mouth as needed (Yeast infection).  12/19/19   [provider]  ibuprofen (ADVIL) 600 MG tablet Take 1 tablet (600 mg total) by mouth every 6 (six) hours as needed. 03/21/22   Volney American, PA-C  metFORMIN (GLUCOPHAGE-XR) 500  MG 24 hr tablet Take 500 mg by mouth 2 (two) times daily. 01/27/22   [provider]  metoprolol tartrate (LOPRESSOR) 100 MG tablet Take 1 tablet (100 mg total) by mouth 2 (two) times daily. 01/18/20   Corum, Rex Kras, MD  Misc. Devices (WRIST BRACE) MISC 2 Pieces by Does not apply route at bedtime. 02/03/22   Dohmeier, Asencion Partridge, MD  nortriptyline (PAMELOR) 50 MG capsule Take 50 mg by mouth daily.     [provider]  omeprazole (PRILOSEC) 20 MG capsule Take 20 mg by mouth daily.    [provider]  predniSONE (DELTASONE) 10 MG tablet Take 10 mg by mouth as needed (Back pain).  12/19/19   [provider]  promethazine-dextromethorphan (PROMETHAZINE-DM) 6.25-15 MG/5ML syrup Take 5 mLs by mouth 4 (four) times daily as needed. 03/21/22   Volney American, PA-C     Allergies    Morphine, Tape, and Vancomycin   Review of Systems   Review of Systems Please see HPI for pertinent positives and negatives  Physical Exam BP (!) 144/76   Pulse 86   Temp 98 F (36.7 C) (Oral)   Resp 18   Ht '5\' 5"'$  (1.651 m)   Wt 83.9 kg   SpO2 94%   BMI 30.79 kg/m   Physical Exam Vitals and nursing note reviewed.  Constitutional:      General: She is not in acute distress.    Appearance: Normal  appearance.  HENT:     Head: Normocephalic and atraumatic.     Nose: Nose normal.     Mouth/Throat:     Mouth: Mucous membranes are moist.  Eyes:     Extraocular Movements: Extraocular movements intact.     Conjunctiva/sclera: Conjunctivae normal.  Cardiovascular:     Rate and Rhythm: Normal rate.  Pulmonary:     Effort: Pulmonary effort is normal.     Breath sounds: Normal breath sounds.  Abdominal:     General: Abdomen is flat.     Palpations: Abdomen is soft.     Tenderness: There is no abdominal tenderness.  Musculoskeletal:        General: No swelling. Normal range of motion.     Cervical back: Neck supple.  Skin:    General: Skin is warm and dry.  Neurological:      General: No focal deficit present.     Mental Status: She is alert.  Psychiatric:        Mood and Affect: Mood normal.     ED Results / Procedures / Treatments   EKG None  Procedures Procedures  Medications Ordered in the ED Medications  fentaNYL (SUBLIMAZE) injection 50 mcg (50 mcg Intravenous Given 11/04/22 0413)  ondansetron (ZOFRAN) injection 4 mg (4 mg Intravenous Given 11/04/22 0413)  sodium chloride 0.9 % bolus 500 mL (0 mLs Intravenous Stopped 11/04/22 0531)    Initial Impression and Plan  Patient here with symptoms concerning for renal colic. UA shows blood, no other convincing signs of infection. Will check labs and send for CT. Pain/Nausea meds and IVF for comfort.   ED Course   Clinical Course as of 11/04/22 0535  Tue Nov 04, 2022  0423 CBC with mild leukocytosis.  [CS]  0623 I personally viewed the images from radiology studies and agree with radiologist interpretation: Small proximal L ureteral stone with hydro. Awaiting chemisty.   [CS]  7628 BMP is unremarkable.  [CS]  0532 Patient reports pain improved, she feels better and would like to go home. Plan discharge with urine strainer, Rx for norco, naprosyn, zofran, flomax and outpatient urology follow up. RTED for any other concerns.  [CS]    Clinical Course User Index [CS] Truddie Hidden, MD     MDM Rules/Calculators/A&P Medical Decision Making Problems Addressed: Ureterolithiasis: acute illness or injury  Amount and/or Complexity of Data Reviewed Labs: ordered. Decision-making details documented in ED Course. Radiology: ordered and independent interpretation performed. Decision-making details documented in ED Course.  Risk Prescription drug management. Parenteral controlled substances.    Final Clinical Impression(s) / ED Diagnoses Final diagnoses:  Ureterolithiasis    Rx / DC Orders ED Discharge Orders          Ordered    naproxen (NAPROSYN) 500 MG tablet  2 times daily         11/04/22 0534    ondansetron (ZOFRAN-ODT) 4 MG disintegrating tablet  Every 8 hours PRN        11/04/22 0534    tamsulosin (FLOMAX) 0.4 MG CAPS capsule  Daily        11/04/22 0534    HYDROcodone-acetaminophen (NORCO/VICODIN) 5-325 MG tablet  Every 6 hours PRN        11/04/22 0534             Truddie Hidden, MD 11/04/22 [phone]

## 2022-11-04 NOTE — ED Triage Notes (Signed)
Pt c/o left flank pain x2 days. Reports urinary frequency.

## 2022-11-05 DIAGNOSIS — R002 Palpitations: Secondary | ICD-10-CM | POA: Diagnosis not present

## 2022-11-05 DIAGNOSIS — I700 Atherosclerosis of aorta: Secondary | ICD-10-CM | POA: Diagnosis not present

## 2022-11-05 DIAGNOSIS — I10 Essential (primary) hypertension: Secondary | ICD-10-CM | POA: Diagnosis not present

## 2022-11-05 DIAGNOSIS — N390 Urinary tract infection, site not specified: Secondary | ICD-10-CM | POA: Insufficient documentation

## 2022-11-05 DIAGNOSIS — Z853 Personal history of malignant neoplasm of breast: Secondary | ICD-10-CM | POA: Diagnosis not present

## 2022-11-05 DIAGNOSIS — N202 Calculus of kidney with calculus of ureter: Secondary | ICD-10-CM | POA: Diagnosis not present

## 2022-12-10 DIAGNOSIS — E118 Type 2 diabetes mellitus with unspecified complications: Secondary | ICD-10-CM | POA: Diagnosis not present

## 2022-12-10 DIAGNOSIS — E782 Mixed hyperlipidemia: Secondary | ICD-10-CM | POA: Diagnosis not present

## 2022-12-10 DIAGNOSIS — R002 Palpitations: Secondary | ICD-10-CM | POA: Diagnosis not present

## 2022-12-12 ENCOUNTER — Other Ambulatory Visit: Payer: Self-pay

## 2022-12-12 ENCOUNTER — Ambulatory Visit (HOSPITAL_COMMUNITY)
Admission: RE | Admit: 2022-12-12 | Discharge: 2022-12-12 | Disposition: A | Discharge status: Home / Self Care | Payer: PPO | Source: Ambulatory Visit | Attending: Urology | Admitting: Urology

## 2022-12-12 ENCOUNTER — Encounter: Payer: Self-pay | Admitting: Urology

## 2022-12-12 ENCOUNTER — Ambulatory Visit (INDEPENDENT_AMBULATORY_CARE_PROVIDER_SITE_OTHER): Payer: PPO | Admitting: Urology

## 2022-12-12 VITALS — BP 123/79 | HR 94

## 2022-12-12 DIAGNOSIS — M5136 Other intervertebral disc degeneration, lumbar region: Secondary | ICD-10-CM | POA: Diagnosis not present

## 2022-12-12 DIAGNOSIS — N201 Calculus of ureter: Secondary | ICD-10-CM | POA: Diagnosis not present

## 2022-12-12 DIAGNOSIS — N200 Calculus of kidney: Secondary | ICD-10-CM

## 2022-12-12 DIAGNOSIS — I878 Other specified disorders of veins: Secondary | ICD-10-CM | POA: Diagnosis not present

## 2022-12-12 NOTE — Patient Instructions (Signed)

## 2022-12-12 NOTE — Progress Notes (Signed)
12/12/2022 11:09 AM   Lauren Sullivan April 08, 1951 [id number]  Referring provider: Celene Squibb, MD [address] Lauren Sullivan,  [place] [address]  nephrolithiasis   HPI: [name] is a [phone] here for evaluation of nephrolithiasis. 11/04/2022 she developed left flank pain and presented to the ER. She was diagoned with a 76m left UPJ calculus. She passed the calculus and brought it with her today. She has a right 16mright mid to lower pole calculus. She denies any flank pain   PMH: Past Medical History:  Diagnosis Date   Arthritis    Blood transfusion without reported diagnosis    [place]   Cataract    Di[place] complication ([place]   GERD (gastroesophageal reflux disease)    Hypertension     Surgical History: Past Surgical History:  Procedure Laterality Date   BIOPSY  01/02/2020   Procedure: BIOPSY;  Surgeon: RoDaneil DolinMD;  [facility];  Service: Endoscopy;;  colon   BREAST SURGERY     CHOLECYSTECTOMY     COLONOSCOPY  2008   Dr. RoGala RomneyMinimally friable anal canal, patient only boggy edematous terminal ileum.  Biopsies from the terminal ileum were benign.  Random colon biopsies benign.  No evidence of microscopic colitis or inflammatory bowel disease.   COLONOSCOPY WITH PROPOFOL N/A 01/02/2020   Lauren Sullivan: 2 tubular adenomas removed, random colon biopsies negative.  Next colonoscopy 5 years.   EYE SURGERY     POLYPECTOMY  01/02/2020   Procedure: POLYPECTOMY;  Surgeon: RoDaneil DolinMD;  [facility];  Service: Endoscopy;;   TUBAL LIGATION      Home Medications:  Allergies as of 12/12/2022       Reactions   Morphine Nausea And Vomiting   Tape Dermatitis   Paper tape "breaks me out"   Vancomycin Itching, Rash        Medication List        Accurate as of December 12, 2022 11:09 AM. If you have any questions, ask your nurse or doctor.          acetaminophen 500 MG tablet Commonly known as: TYLENOL Take 1,000  mg by mouth every 6 (six) hours as needed for headache.   amLODipine 5 MG tablet Commonly known as: NORVASC Take 5 mg by mouth daily.   azelastine 0.1 % nasal spray Commonly known as: ASTELIN Place 1 spray into both nostrils 2 (two) times daily. Use in each nostril as directed   fluconazole 150 MG tablet Commonly known as: DIFLUCAN Take 150 mg by mouth as needed (Yeast infection).   HYDROcodone-acetaminophen 5-325 MG tablet Commonly known as: NORCO/VICODIN Take 1 tablet by mouth every 6 (six) hours as needed for severe pain.   ibuprofen 600 MG tablet Commonly known as: ADVIL Take 1 tablet (600 mg total) by mouth every 6 (six) hours as needed.   metFORMIN 500 MG 24 hr tablet Commonly known as: GLUCOPHAGE-XR Take 500 mg by mouth 2 (two) times daily.   metoprolol tartrate 100 MG tablet Commonly known as: LOPRESSOR Take 1 tablet (100 mg total) by mouth 2 (two) times daily.   naproxen 500 MG tablet Commonly known as: NAPROSYN Take 1 tablet (500 mg total) by mouth 2 (two) times daily.   nortriptyline 50 MG capsule Commonly known as: PAMELOR Take 50 mg by mouth daily.   omeprazole 20 MG capsule Commonly known as: PRILOSEC Take 20 mg by mouth daily.  ondansetron 4 MG disintegrating tablet Commonly known as: ZOFRAN-ODT Take 1 tablet (4 mg total) by mouth every 8 (eight) hours as needed for nausea or vomiting.   predniSONE 10 MG tablet Commonly known as: DELTASONE Take 10 mg by mouth as needed (Back pain).   promethazine-dextromethorphan 6.25-15 MG/5ML syrup Commonly known as: PROMETHAZINE-DM Take 5 mLs by mouth 4 (four) times daily as needed.   Wrist Brace Misc 2 Pieces by Does not apply route at bedtime.        Allergies:  Allergies  Allergen Reactions   Morphine Nausea And Vomiting   Tape Dermatitis    Paper tape "breaks me out"   Vancomycin Itching and Rash    Family History: Family History  Problem Relation Age of Onset   Cancer Mother         pancreatic   Heart disease Mother    Hypertension Mother    Hypertension Father    Colon cancer Neg Hx     Social History:  reports that she has quit smoking. She has never been exposed to tobacco smoke. She has never used smokeless tobacco. She reports that she does not drink alcohol and does not use drugs.  ROS: All other review of systems were reviewed and are negative except what is noted above in HPI  Physical Exam: BP 123/79   Pulse 94   Constitutional:  Alert and oriented, No acute distress. HEENT: [place] AT, moist mucus membranes.  Trachea midline, no masses. Cardiovascular: No clubbing, cyanosis, or edema. Respiratory: Normal respiratory effort, no increased work of breathing. GI: Abdomen is soft, nontender, nondistended, no abdominal masses GU: No CVA tenderness.  Lymph: No cervical or inguinal lymphadenopathy. Skin: No rashes, bruises or suspicious lesions. Neurologic: Grossly intact, no focal deficits, moving all 4 extremities. Psychiatric: Normal mood and affect.  Laboratory Data: Lab Results  Component Value Date   WBC 12.8 (H) 11/04/2022   HGB 14.5 11/04/2022   HCT 44.5 11/04/2022   MCV 88.6 11/04/2022   PLT 297 11/04/2022    Lab Results  Component Value Date   CREATININE 0.93 11/04/2022    No results found for: "PSA"  No results found for: "TESTOSTERONE"  Lab Results  Component Value Date   HGBA1C 6.2 (A) 08/22/2019    Urinalysis    Component Value Date/Time   COLORURINE YELLOW 11/04/2022 0250   APPEARANCEUR HAZY (A) 11/04/2022 0250   LABSPEC 1.025 11/04/2022 0250   PHURINE 6.0 11/04/2022 0250   GLUCOSEU 250 (A) 11/04/2022 0250   HGBUR LARGE (A) 11/04/2022 0250   BILIRUBINUR NEGATIVE 11/04/2022 0250   KETONESUR NEGATIVE 11/04/2022 0250   PROTEINUR NEGATIVE 11/04/2022 0250   UROBILINOGEN 0.2 02/08/2011 1638   NITRITE NEGATIVE 11/04/2022 0250   LEUKOCYTESUR NEGATIVE 11/04/2022 0250    Lab Results  Component Value Date   BACTERIA MANY (A)  11/04/2022    Pertinent Imaging: KUb today: Images reviewed and discussed with patient No results found for this or any previous visit.  No results found for this or any previous visit.  No results found for this or any previous visit.  No results found for this or any previous visit.  Results for orders placed during the hospital encounter of 07/12/14  US Renal  Narrative CLINICAL DATA:  Hypertension.  EXAM: RENAL/URINARY TRACT ULTRASOUND COMPLETE  COMPARISON:  CT abdomen 02/26/2011.  FINDINGS: Right Kidney:  Length: 12.9 cm. Echogenicity within normal limits. No mass or hydronephrosis visualized.  Left Kidney:  Length: 10.6 cm. Echogenicity within normal  limits. No mass or hydronephrosis visualized.  Bladder:  Appears normal for degree of bladder distention.  Similar appearance to prior CT.  IMPRESSION: Negative.   Electronically Signed By: Rolla Flatten M.D. On: 07/12/2014 10:37  No valid procedures specified. No results found for this or any previous visit.  Results for orders placed during the hospital encounter of 11/04/22  CT Renal Stone Study  Narrative CLINICAL DATA:  Left-sided flank pain for 2 days, initial encounter  EXAM: CT ABDOMEN AND PELVIS WITHOUT CONTRAST  TECHNIQUE: Multidetector CT imaging of the abdomen and pelvis was performed following the standard protocol without IV contrast.  RADIATION DOSE REDUCTION: This exam was performed according to the departmental dose-optimization program which includes automated exposure control, adjustment of the mA and/or kV according to patient size and/or use of iterative reconstruction technique.  COMPARISON:  02/26/2011  FINDINGS: Lower chest: No acute abnormality. Changes of prior right mastectomy are noted.  Hepatobiliary: No focal liver abnormality is seen. Status post cholecystectomy. No biliary dilatation.  Pancreas: Diffuse fatty infiltration of the pancreas is  seen.  Spleen: Normal in size without focal abnormality.  Adrenals/Urinary Tract: Renal glands are within normal limits. Right kidney shows punctate renal stone in the lower pole. No obstructive changes are seen. The left kidney demonstrates significant hydronephrosis and increased perinephric stranding secondary to a 1-2 mm stone in the proximal left ureter. More distal left ureter is within normal limits. The bladder is incompletely distended.  Stomach/Bowel: Mild diverticular change of the colon is noted. No diverticulitis is seen. The appendix is not visualized and may have been surgically removed. No inflammatory changes are noted. Small bowel and stomach appear within normal limits.  Vascular/Lymphatic: Aortic atherosclerosis. No enlarged abdominal or pelvic lymph nodes.  Reproductive: Uterus and bilateral adnexa are unremarkable.  Other: No abdominal wall hernia or abnormality. No abdominopelvic ascites.  Musculoskeletal: Degenerative changes of lumbar spine are noted.  IMPRESSION: 1-2 mm stone in the proximal left ureter causing left-sided hydronephrosis and significant perinephric stranding.  Tiny nonobstructing right renal stone.   Electronically Signed By: Inez Catalina M.D. On: 11/04/2022 04:04   Assessment & Plan:    1. Kidney stones Dietary handout given -followup 3 month renal US - Urinalysis, Routine w reflex microscopic   No follow-ups on file.  Nicolette Bang, MD  [facility] [place]

## 2022-12-15 DIAGNOSIS — I700 Atherosclerosis of aorta: Secondary | ICD-10-CM | POA: Diagnosis not present

## 2022-12-15 DIAGNOSIS — M130 Polyarthritis, unspecified: Secondary | ICD-10-CM | POA: Diagnosis not present

## 2022-12-15 DIAGNOSIS — I10 Essential (primary) hypertension: Secondary | ICD-10-CM | POA: Diagnosis not present

## 2022-12-15 DIAGNOSIS — E782 Mixed hyperlipidemia: Secondary | ICD-10-CM | POA: Diagnosis not present

## 2022-12-15 DIAGNOSIS — K580 Irritable bowel syndrome with diarrhea: Secondary | ICD-10-CM | POA: Diagnosis not present

## 2022-12-15 DIAGNOSIS — B372 Candidiasis of skin and nail: Secondary | ICD-10-CM | POA: Diagnosis not present

## 2022-12-15 DIAGNOSIS — N202 Calculus of kidney with calculus of ureter: Secondary | ICD-10-CM | POA: Diagnosis not present

## 2022-12-15 DIAGNOSIS — Z853 Personal history of malignant neoplasm of breast: Secondary | ICD-10-CM | POA: Diagnosis not present

## 2022-12-15 DIAGNOSIS — E1169 Type 2 diabetes mellitus with other specified complication: Secondary | ICD-10-CM | POA: Diagnosis not present

## 2022-12-15 DIAGNOSIS — D126 Benign neoplasm of colon, unspecified: Secondary | ICD-10-CM | POA: Insufficient documentation

## 2022-12-15 DIAGNOSIS — T466X5A Adverse effect of antihyperlipidemic and antiarteriosclerotic drugs, initial encounter: Secondary | ICD-10-CM | POA: Diagnosis not present

## 2022-12-15 DIAGNOSIS — M542 Cervicalgia: Secondary | ICD-10-CM | POA: Diagnosis not present

## 2022-12-15 DIAGNOSIS — K219 Gastro-esophageal reflux disease without esophagitis: Secondary | ICD-10-CM | POA: Diagnosis not present

## 2022-12-16 ENCOUNTER — Other Ambulatory Visit: Payer: Self-pay | Admitting: Internal Medicine

## 2022-12-16 DIAGNOSIS — Z853 Personal history of malignant neoplasm of breast: Secondary | ICD-10-CM

## 2022-12-17 ENCOUNTER — Other Ambulatory Visit: Payer: Self-pay | Admitting: Internal Medicine

## 2022-12-17 DIAGNOSIS — Z853 Personal history of malignant neoplasm of breast: Secondary | ICD-10-CM

## 2022-12-19 LAB — CALCULI, WITH PHOTOGRAPH (CLINICAL LAB)
Calcium Oxalate Dihydrate: 100 %
Weight Calculi: 1 mg

## 2022-12-23 ENCOUNTER — Other Ambulatory Visit: Payer: PPO

## 2023-01-06 ENCOUNTER — Ambulatory Visit
Admission: RE | Admit: 2023-01-06 | Discharge: 2023-01-06 | Disposition: A | Discharge status: Home / Self Care | Payer: PPO | Source: Ambulatory Visit | Attending: Internal Medicine | Admitting: Internal Medicine

## 2023-01-06 ENCOUNTER — Other Ambulatory Visit: Payer: Self-pay | Admitting: Internal Medicine

## 2023-01-06 DIAGNOSIS — R921 Mammographic calcification found on diagnostic imaging of breast: Secondary | ICD-10-CM

## 2023-01-06 DIAGNOSIS — Z853 Personal history of malignant neoplasm of breast: Secondary | ICD-10-CM

## 2023-01-08 ENCOUNTER — Ambulatory Visit: Payer: PPO | Attending: Internal Medicine | Admitting: Internal Medicine

## 2023-01-08 ENCOUNTER — Encounter: Payer: Self-pay | Admitting: Internal Medicine

## 2023-01-08 ENCOUNTER — Ambulatory Visit (INDEPENDENT_AMBULATORY_CARE_PROVIDER_SITE_OTHER): Payer: PPO

## 2023-01-08 VITALS — BP 146/84 | HR 73 | Ht 65.0 in | Wt 188.0 lb

## 2023-01-08 DIAGNOSIS — R002 Palpitations: Secondary | ICD-10-CM

## 2023-01-08 DIAGNOSIS — G4733 Obstructive sleep apnea (adult) (pediatric): Secondary | ICD-10-CM | POA: Insufficient documentation

## 2023-01-08 DIAGNOSIS — Z8679 Personal history of other diseases of the circulatory system: Secondary | ICD-10-CM | POA: Insufficient documentation

## 2023-01-08 DIAGNOSIS — I341 Nonrheumatic mitral (valve) prolapse: Secondary | ICD-10-CM | POA: Diagnosis not present

## 2023-01-08 NOTE — Patient Instructions (Signed)
Medication Instructions:  Your physician recommends that you continue on your current medications as directed. Please refer to the Current Medication list given to you today.  [place]   *If you need a refill on your cardiac medications before your next appointment, please call your pharmacy*   Lab Work: NONE   If you have labs (blood work) drawn today and your tests are completely normal, you will receive your results only by: [place] (if you have MyChart) OR A paper copy in the mail If you have any lab test that is abnormal or we need to change your treatment, we will call you to review the results.   Testing/Procedures: Your physician has requested that you have an echocardiogram. Echocardiography is a painless test that uses sound waves to create images of your heart. It provides your doctor with information about the size and shape of your heart and how well your heart's chambers and valves are working. This procedure takes approximately one hour. There are no restrictions for this procedure. Please do NOT wear cologne, perfume, aftershave, or lotions (deodorant is allowed). Please arrive 15 minutes prior to your appointment time.  ZIO XT- Long Term Monitor Instructions   Your physician has requested you wear your ZIO patch monitor___14__days.   This is a single patch monitor.  [employer] supplies one patch monitor per enrollment.  Additional stickers are not available.   Please do not apply patch if you will be having a Nuclear Stress Test, Echocardiogram, Cardiac CT, MRI, or Chest Xray during the time frame you would be wearing the monitor. The patch cannot be worn during these tests.  You cannot remove and re-apply the ZIO XT patch monitor.   Your ZIO patch monitor will be sent USPS Priority mail from [facility] directly to your home address. The monitor may also be mailed to a PO BOX if home delivery is not available.   It may take 3-5 days to receive your  monitor after you have been enrolled.   Once you have received you monitor, please review enclosed instructions.  Your monitor has already been registered assigning a specific monitor serial # to you.   Applying the monitor   Shave hair from upper left chest.   Hold abrader disc by orange tab.  Rub abrader in 40 strokes over left upper chest as indicated in your monitor instructions.   Clean area with 4 enclosed alcohol pads .  Use all pads to assure are is cleaned thoroughly.  Let dry.   Apply patch as indicated in monitor instructions.  Patch will be place under collarbone on left side of chest with arrow pointing upward.   Rub patch adhesive wings for 2 minutes.Remove white label marked "1".  Remove white label marked "2".  Rub patch adhesive wings for 2 additional minutes.   While looking in a mirror, press and release button in center of patch.  A small green light will flash 3-4 times .  This will be your only indicator the monitor has been turned on.     Do not shower for the first 24 hours.  You may shower after the first 24 hours.   Press button if you feel a symptom. You will hear a small click.  Record Date, Time and Symptom in the Patient Log Book.   When you are ready to remove patch, follow instructions on last 2 pages of Patient Log Book.  Stick patch monitor onto last page of Patient Log Book.  Place Patient Log Book in [place] box.  Use locking tab on box and tape box closed securely.  The [employer] has [employer] on it.  Please place in mailbox as soon as possible.  Your physician should have your test results approximately 7 days after the monitor has been mailed back to [facility].   Call [place] at [phone] if you have questions regarding your ZIO XT patch monitor.  Call them immediately if you see an orange light blinking on your monitor.   If your monitor falls off in less than 4 days contact our Monitor department at  [phone].  If your monitor becomes loose or falls off after 4 days call [employer] at [phone] for suggestions on securing your monitor.     Follow-Up: At [facility], you and your health needs are our priority.  As part of our continuing mission to provide you with exceptional heart care, we have created designated Provider Care Teams.  These Care Teams include your primary Cardiologist (physician) and Advanced Practice Providers (APPs -  Physician Assistants and Nurse Practitioners) who all work together to provide you with the care you need, when you need it.  We recommend signing up for the patient portal called "MyChart".  Sign up information is provided on this After Visit Summary.  MyChart is used to connect with patients for Virtual Visits (Telemedicine).  Patients are able to view lab/test results, encounter notes, upcoming appointments, etc.  Non-urgent messages can be sent to your provider as well.   To learn more about what you can do with MyChart, go to [email or web address].    Your next appointment:   6 month(s)  Provider:   Claudina Lick, MD    Other Instructions Thank you for choosing [facility]!

## 2023-01-08 NOTE — Progress Notes (Signed)
Cardiology Office Note  Date: 01/08/2023   ID: Lauren, Sullivan 1951/06/27, MRN [id number]  PCP:  Lauren Squibb, MD  Cardiologist:  None Electrophysiologist:  None   Reason for Office Visit: Evaluation of palpitations at the request Dr. Nevada Crane   History of Present Illness: Lauren Sullivan is a 72 y.o. female known to have HTN, DM 2, recurrent breast cancer was referred to cardiology clinic for evaluation of palpitations.  Patient reported that she was having palpitations that started 2 months ago, lasting from few minutes to a couple of hours, occurring few times per week the last episode was couple weeks ago. She was diagnosed with OSA almost 20 years ago but was told that she does not need treatment. No prior history of atrial arrhythmias. No prior ischemia evaluation. No prior MI/PCI/CABG.  She says she was diagnosed with mitral valve prolapse many many years ago.  Otherwise, denies angina, DOE, dizziness, lightheadedness, leg swelling.  Denies smoking cigarettes.  Patient has upcoming breast biopsy procedure scheduled on January 19, 2023.  Past Medical History:  Diagnosis Date   Arthritis    Blood transfusion without reported diagnosis    Breast CA ([facility])    Cataract    Diabetes mellitus without complication ([place])    GERD (gastroesophageal reflux disease)    Hypertension     Past Surgical History:  Procedure Laterality Date   BIOPSY  01/02/2020   Procedure: BIOPSY;  Surgeon: Daneil Dolin, MD;  [facility];  Service: Endoscopy;;  colon   BREAST SURGERY     CHOLECYSTECTOMY     COLONOSCOPY  2008   Dr. Gala Romney: Minimally friable anal canal, patient only boggy edematous terminal ileum.  Biopsies from the terminal ileum were benign.  Random colon biopsies benign.  No evidence of microscopic colitis or inflammatory bowel disease.   COLONOSCOPY WITH PROPOFOL N/A 01/02/2020   Rourk: 2 tubular adenomas removed, random colon biopsies negative.  Next colonoscopy 5 years.   EYE  SURGERY     MASTECTOMY Right    POLYPECTOMY  01/02/2020   Procedure: POLYPECTOMY;  Surgeon: Daneil Dolin, MD;  [facility];  Service: Endoscopy;;   TUBAL LIGATION      Current Outpatient Medications  Medication Sig Dispense Refill   acetaminophen (TYLENOL) 500 MG tablet Take 1,000 mg by mouth every 6 (six) hours as needed for headache.     celecoxib (CELEBREX) 100 MG capsule Take 100 mg by mouth daily.     ezetimibe (ZETIA) 10 MG tablet Take 10 mg by mouth daily.     fluconazole (DIFLUCAN) 150 MG tablet Take 150 mg by mouth. As needed     metoprolol tartrate (LOPRESSOR) 100 MG tablet Take 1 tablet (100 mg total) by mouth 2 (two) times daily. (Patient taking differently: Take 100 mg by mouth daily.) 60 tablet 3   nortriptyline (PAMELOR) 50 MG capsule Take 50 mg by mouth daily.      pantoprazole (PROTONIX) 40 MG tablet Take 40 mg by mouth daily.     RYBELSUS 14 MG TABS Take 1 tablet by mouth every morning.     amLODipine (NORVASC) 5 MG tablet Take 5 mg by mouth daily. Takes 2 a day     metFORMIN (GLUCOPHAGE-XR) 500 MG 24 hr tablet Take 500 mg by mouth 2 (two) times daily.     No current facility-administered medications for this visit.   Allergies:  Morphine, Tape, and Vancomycin   Social History: The patient  reports that she has quit smoking. She has never been exposed to tobacco smoke. She has never used smokeless tobacco. She reports that she does not drink alcohol and does not use drugs.   Family History: The patient's family history includes Cancer in her mother; Heart disease in her mother; Hypertension in her father and mother.   ROS:  Please see the history of present illness. Otherwise, complete review of systems is positive for none.  All other systems are reviewed and negative.   Physical Exam: VS:  BP (!) 146/84   Pulse 73   Ht 5\' 5"  (1.651 m)   Wt 188 lb (85.3 kg)   SpO2 94%   BMI 31.28 kg/m , BMI Body mass index is 31.28 kg/m.  Wt Readings from Last  3 Encounters:  01/08/23 188 lb (85.3 kg)  11/04/22 185 lb (83.9 kg)  02/03/22 211 lb (95.7 kg)    General: Patient appears comfortable at rest. HEENT: Conjunctiva and lids normal, oropharynx clear with moist mucosa. Neck: Supple, no elevated JVP or carotid bruits, no thyromegaly. Lungs: Clear to auscultation, nonlabored breathing at rest. Cardiac: Regular rate and rhythm, no S3 or significant systolic murmur, no pericardial rub. Abdomen: Soft, nontender, no hepatomegaly, bowel sounds present, no guarding or rebound. Extremities: No pitting edema, distal pulses 2+. Skin: Warm and dry. Musculoskeletal: No kyphosis. Neuropsychiatric: Alert and oriented x3, affect grossly appropriate.  ECG:  NSR  Recent Labwork: 11/04/2022: BUN 15; Creatinine, Ser 0.93; Hemoglobin 14.5; Platelets 297; Potassium 3.8; Sodium 136     Component Value Date/Time   CHOL 189 08/22/2019 0859   TRIG 141 08/22/2019 0859   HDL 44 08/22/2019 0859   CHOLHDL 4.3 08/22/2019 0859   LDLCALC 120 08/22/2019 0859    Other Studies Reviewed Today:   Assessment and Plan: Patient is a 72 year old F known to have HTN, DM 2 was referred to cardiology clinic for evaluation of palpitations.  # Palpitations -Palpitations last from few minutes to couple of hours. Last episode was couple weeks ago. She was diagnosed with OSA in the past but not on any treatment. Will obtain 2-week event monitor to rule out any atrial arrhythmias due to self-reported history of OSA. Patient can place event monitor after the breast biopsy procedure. If the event monitor is unremarkable, strongly recommended to purchase Kardia mobile and monitor heart rhythm.  # History of MVP -Obtain 2D echocardiogram  # History of OSA -STOP-BANG score is 4. She was diagnosed with OSA in the past. Pulm referral for OSA evaluation.  # HTN, controlled -Continue amlodipine 10 mg once daily -HTN management per PCP  I have spent a total of 45 minutes with  patient reviewing chart, EKGs, labs and examining patient as well as establishing an assessment and plan that was discussed with the patient.  > 50% of time was spent in direct patient care.     Medication Adjustments/Labs and Tests Ordered: Current medicines are reviewed at length with the patient today.  Concerns regarding medicines are outlined above.   Tests Ordered: Orders Placed This Encounter  Procedures   Ambulatory referral to Pulmonology   LONG TERM MONITOR (3-14 DAYS)   EKG 12-Lead   ECHOCARDIOGRAM COMPLETE     Medication Changes: No orders of the defined types were placed in this encounter.   Disposition:  Follow up  pending results  Signed, [place] Fidel Levy, MD, 01/08/2023 11:00 AM    [place] [facility] [address] [place],  [place] [address]

## 2023-01-19 ENCOUNTER — Ambulatory Visit
Admission: RE | Admit: 2023-01-19 | Discharge: 2023-01-19 | Disposition: A | Discharge status: Home / Self Care | Payer: PPO | Source: Ambulatory Visit | Attending: Internal Medicine | Admitting: Internal Medicine

## 2023-01-19 DIAGNOSIS — R921 Mammographic calcification found on diagnostic imaging of breast: Secondary | ICD-10-CM

## 2023-01-19 DIAGNOSIS — N6489 Other specified disorders of breast: Secondary | ICD-10-CM | POA: Diagnosis not present

## 2023-01-19 HISTORY — PX: BREAST BIOPSY: SHX20

## 2023-01-22 DIAGNOSIS — R002 Palpitations: Secondary | ICD-10-CM

## 2023-02-05 ENCOUNTER — Ambulatory Visit (HOSPITAL_COMMUNITY)
Admission: RE | Admit: 2023-02-05 | Discharge: 2023-02-05 | Disposition: A | Discharge status: Home / Self Care | Payer: PPO | Source: Ambulatory Visit | Attending: Internal Medicine | Admitting: Internal Medicine

## 2023-02-05 DIAGNOSIS — I341 Nonrheumatic mitral (valve) prolapse: Secondary | ICD-10-CM

## 2023-02-05 LAB — ECHOCARDIOGRAM COMPLETE
Area-P 1/2: 3.1 cm2
S' Lateral: 2.8 cm

## 2023-02-05 NOTE — Progress Notes (Signed)
  Echocardiogram 2D Echocardiogram has been performed.  Lauren Sullivan 02/05/2023, 12:07 PM

## 2023-02-09 ENCOUNTER — Telehealth: Payer: Self-pay

## 2023-02-09 NOTE — Telephone Encounter (Signed)
-----   Message from Marjo Bicker, MD sent at 02/06/2023 11:11 AM EDT ----- Mitral valve is not well-visualized, grossly normal on echocardiogram.  There is trivial leakiness across mitral valve, trivial MR.  Otherwise, normal pumping function of the heart.

## 2023-02-09 NOTE — Telephone Encounter (Signed)
Patient notified and verbalized understanding. Patient had no questions or concerns at this time. PCP copied 

## 2023-02-16 ENCOUNTER — Inpatient Hospital Stay (HOSPITAL_COMMUNITY): Admission: RE | Admit: 2023-02-16 | Payer: PPO | Source: Ambulatory Visit

## 2023-02-16 DIAGNOSIS — R002 Palpitations: Secondary | ICD-10-CM | POA: Diagnosis not present

## 2023-02-18 ENCOUNTER — Ambulatory Visit (HOSPITAL_COMMUNITY)
Admission: RE | Admit: 2023-02-18 | Discharge: 2023-02-18 | Disposition: A | Discharge status: Home / Self Care | Payer: PPO | Source: Ambulatory Visit | Attending: Urology | Admitting: Urology

## 2023-02-18 DIAGNOSIS — N200 Calculus of kidney: Secondary | ICD-10-CM

## 2023-02-25 ENCOUNTER — Other Ambulatory Visit (HOSPITAL_COMMUNITY): Payer: PPO

## 2023-03-02 ENCOUNTER — Ambulatory Visit (INDEPENDENT_AMBULATORY_CARE_PROVIDER_SITE_OTHER): Payer: PPO | Admitting: Urology

## 2023-03-02 ENCOUNTER — Encounter: Payer: Self-pay | Admitting: Urology

## 2023-03-02 VITALS — BP 137/78 | HR 76

## 2023-03-02 DIAGNOSIS — N200 Calculus of kidney: Secondary | ICD-10-CM

## 2023-03-02 DIAGNOSIS — N201 Calculus of ureter: Secondary | ICD-10-CM

## 2023-03-02 DIAGNOSIS — N1330 Unspecified hydronephrosis: Secondary | ICD-10-CM

## 2023-03-02 DIAGNOSIS — N3000 Acute cystitis without hematuria: Secondary | ICD-10-CM

## 2023-03-02 LAB — URINALYSIS, ROUTINE W REFLEX MICROSCOPIC
Bilirubin, UA: NEGATIVE
Glucose, UA: NEGATIVE
Ketones, UA: NEGATIVE
Nitrite, UA: NEGATIVE
Protein,UA: NEGATIVE
Specific Gravity, UA: 1.02 (ref 1.005–1.030)
Urobilinogen, Ur: 0.2 mg/dL (ref 0.2–1.0)
pH, UA: 6.5 (ref 5.0–7.5)

## 2023-03-02 LAB — MICROSCOPIC EXAMINATION

## 2023-03-02 MED ORDER — NITROFURANTOIN MONOHYD MACRO 100 MG PO CAPS: 100.0000 mg | ORAL_CAPSULE | Freq: Two times a day (BID) | ORAL | 0 refills | Status: DC

## 2023-03-02 NOTE — Patient Instructions (Signed)

## 2023-03-02 NOTE — Progress Notes (Unsigned)
03/02/2023 2:24 PM   MAIRYN POLHAMUS [birth year] [id number]  Referring provider: Benita Stabile, MD [address] [place],  [place] [address]  Chief Complaint  Patient presents with   RENAL US results    HPI: Ms Gratzer is a 64XB here for followup for chronic cystitis and nephrolithiasis. She has had 1 UTI since last.    PMH: Past Medical History:  Diagnosis Date   Arthritis    Blood transfusion without reported diagnosis    Breast CA ([facility])    Cataract    Diabetes mellitus without complication ([facility])    GERD (gastroesophageal reflux disease)    Hypertension     Surgical History: Past Surgical History:  Procedure Laterality Date   BIOPSY  01/02/2020   Procedure: BIOPSY;  Surgeon: Corbin Ade, MD;  [facility];  Service: Endoscopy;;  colon   BREAST BIOPSY Left 01/19/2023   MM LT BREAST BX W LOC DEV 1ST LESION IMAGE BX SPEC STEREO GUIDE 01/19/2023 [facility]   BREAST SURGERY     CHOLECYSTECTOMY     COLONOSCOPY  2008   Dr. Jena Gauss: Minimally friable anal canal, patient only boggy edematous terminal ileum.  Biopsies from the terminal ileum were benign.  Random colon biopsies benign.  No evidence of microscopic colitis or inflammatory bowel disease.   COLONOSCOPY WITH PROPOFOL N/A 01/02/2020   Rourk: 2 tubular adenomas removed, random colon biopsies negative.  Next colonoscopy 5 years.   EYE SURGERY     MASTECTOMY Right    POLYPECTOMY  01/02/2020   Procedure: POLYPECTOMY;  Surgeon: Corbin Ade, MD;  [facility];  Service: Endoscopy;;   TUBAL LIGATION      Home Medications:  Allergies as of 03/02/2023       Reactions   Morphine Nausea And Vomiting   Tape Dermatitis   Paper tape "breaks me out"   Vancomycin Itching, Rash        Medication List        Accurate as of Mar 02, 2023  2:24 PM. If you have any questions, ask your nurse or doctor.          acetaminophen 500 MG tablet Commonly known as: TYLENOL Take 1,000 mg by  mouth every 6 (six) hours as needed for headache.   amLODipine 5 MG tablet Commonly known as: NORVASC Take 10 mg by mouth daily.   celecoxib 100 MG capsule Commonly known as: CELEBREX Take 100 mg by mouth daily.   ezetimibe 10 MG tablet Commonly known as: ZETIA Take 10 mg by mouth daily.   fluconazole 150 MG tablet Commonly known as: DIFLUCAN Take 150 mg by mouth. As needed   metFORMIN 500 MG 24 hr tablet Commonly known as: GLUCOPHAGE-XR Take 500 mg by mouth 2 (two) times daily.   metoprolol tartrate 100 MG tablet Commonly known as: LOPRESSOR Take 1 tablet (100 mg total) by mouth 2 (two) times daily. What changed: when to take this   nortriptyline 50 MG capsule Commonly known as: PAMELOR Take 50 mg by mouth daily.   pantoprazole 40 MG tablet Commonly known as: PROTONIX Take 40 mg by mouth daily.   Rybelsus 14 MG Tabs Generic drug: Semaglutide Take 1 tablet by mouth every morning.        Allergies:  Allergies  Allergen Reactions   Morphine Nausea And Vomiting   Tape Dermatitis    Paper tape "breaks me out"   Vancomycin Itching and Rash  Family History: Family History  Problem Relation Age of Onset   Cancer Mother        pancreatic   Heart disease Mother    Hypertension Mother    Hypertension Father    Colon cancer Neg Hx     Social History:  reports that she has quit smoking. She has never been exposed to tobacco smoke. She has never used smokeless tobacco. She reports that she does not drink alcohol and does not use drugs.  ROS: All other review of systems were reviewed and are negative except what is noted above in HPI  Physical Exam: BP 137/78   Pulse 76   Constitutional:  Alert and oriented, No acute distress. HEENT: [place] AT, moist mucus membranes.  Trachea midline, no masses. Cardiovascular: No clubbing, cyanosis, or edema. Respiratory: Normal respiratory effort, no increased work of breathing. GI: Abdomen is soft, nontender,  nondistended, no abdominal masses GU: No CVA tenderness.  Lymph: No cervical or inguinal lymphadenopathy. Skin: No rashes, bruises or suspicious lesions. Neurologic: Grossly intact, no focal deficits, moving all 4 extremities. Psychiatric: Normal mood and affect.  Laboratory Data: Lab Results  Component Value Date   WBC 12.8 (H) 11/04/2022   HGB 14.5 11/04/2022   HCT 44.5 11/04/2022   MCV 88.6 11/04/2022   PLT 297 11/04/2022    Lab Results  Component Value Date   CREATININE 0.93 11/04/2022    No results found for: "PSA"  No results found for: "TESTOSTERONE"  Lab Results  Component Value Date   HGBA1C 6.2 (A) 08/22/2019    Urinalysis    Component Value Date/Time   COLORURINE YELLOW 11/04/2022 0250   APPEARANCEUR HAZY (A) 11/04/2022 0250   LABSPEC 1.025 11/04/2022 0250   PHURINE 6.0 11/04/2022 0250   GLUCOSEU 250 (A) 11/04/2022 0250   HGBUR LARGE (A) 11/04/2022 0250   BILIRUBINUR NEGATIVE 11/04/2022 0250   KETONESUR NEGATIVE 11/04/2022 0250   PROTEINUR NEGATIVE 11/04/2022 0250   UROBILINOGEN 0.2 02/08/2011 1638   NITRITE NEGATIVE 11/04/2022 0250   LEUKOCYTESUR NEGATIVE 11/04/2022 0250    Lab Results  Component Value Date   BACTERIA MANY (A) 11/04/2022    Pertinent Imaging: *** Results for orders placed in visit on 12/12/22  DG Abd 1 View  Narrative CLINICAL DATA:  Kidney stone  EXAM: ABDOMEN - 1 VIEW  COMPARISON:  CT abdomen pelvis 11/04/2022  FINDINGS: No definite urinary tract calcification identified.  Specifically, tiny proximal LEFT ureteral calculus seen on prior CT not identified.  Tiny pelvic phleboliths.  Bone island LEFT sacrum unchanged.  Bones demineralized with degenerative disc disease changes lumbar spine.  Surgical clips RIGHT upper quadrant question cholecystectomy.  Bowel gas pattern normal.  IMPRESSION: No definite urinary tract calcifications identified.   Electronically Signed By: Ulyses Southward M.D. On:  12/12/2022 17:09  No results found for this or any previous visit.  No results found for this or any previous visit.  No results found for this or any previous visit.  Results for orders placed during the hospital encounter of 02/18/23  US RENAL  Narrative CLINICAL DATA:  Intermittent left flank pain  EXAM: RENAL / URINARY TRACT ULTRASOUND COMPLETE  COMPARISON:  CT renal stone protocol November 04, 2022, renal ultrasound July 12, 2014  FINDINGS: Right Kidney:  Renal measurements: 11.3 x 5.0 x 5.3 cm = volume: 140.5 mL. Echogenicity within normal limits. No mass or hydronephrosis visualized. There is a 3 mm punctate echogenic structure in the inferior pole of the right kidney.  Left Kidney:  Renal measurements: 10.8 x 5.4 x 4.7 cm = volume: 145.2 mL. Echogenicity within normal limits. No mass or nephrolithiasis is visualized. There is trace pelviectasis.  Bladder:  Appears normal for degree of bladder distention.  Other:  None.  IMPRESSION: 1. Trace left hydronephrosis. 2. Possible 3 mm punctate renal stone in the inferior pole of the right kidney without significant hydronephrosis.   Electronically Signed By: Jacob Moores M.D. On: 02/20/2023 16:50  No valid procedures specified. No results found for this or any previous visit.  Results for orders placed during the hospital encounter of 11/04/22  CT Renal Stone Study  Narrative CLINICAL DATA:  Left-sided flank pain for 2 days, initial encounter  EXAM: CT ABDOMEN AND PELVIS WITHOUT CONTRAST  TECHNIQUE: Multidetector CT imaging of the abdomen and pelvis was performed following the standard protocol without IV contrast.  RADIATION DOSE REDUCTION: This exam was performed according to the departmental dose-optimization program which includes automated exposure control, adjustment of the mA and/or kV according to patient size and/or use of iterative reconstruction technique.  COMPARISON:   02/26/2011  FINDINGS: Lower chest: No acute abnormality. Changes of prior right mastectomy are noted.  Hepatobiliary: No focal liver abnormality is seen. Status post cholecystectomy. No biliary dilatation.  Pancreas: Diffuse fatty infiltration of the pancreas is seen.  Spleen: Normal in size without focal abnormality.  Adrenals/Urinary Tract: Renal glands are within normal limits. Right kidney shows punctate renal stone in the lower pole. No obstructive changes are seen. The left kidney demonstrates significant hydronephrosis and increased perinephric stranding secondary to a 1-2 mm stone in the proximal left ureter. More distal left ureter is within normal limits. The bladder is incompletely distended.  Stomach/Bowel: Mild diverticular change of the colon is noted. No diverticulitis is seen. The appendix is not visualized and may have been surgically removed. No inflammatory changes are noted. Small bowel and stomach appear within normal limits.  Vascular/Lymphatic: Aortic atherosclerosis. No enlarged abdominal or pelvic lymph nodes.  Reproductive: Uterus and bilateral adnexa are unremarkable.  Other: No abdominal wall hernia or abnormality. No abdominopelvic ascites.  Musculoskeletal: Degenerative changes of lumbar spine are noted.  IMPRESSION: 1-2 mm stone in the proximal left ureter causing left-sided hydronephrosis and significant perinephric stranding.  Tiny nonobstructing right renal stone.   Electronically Signed By: Alcide Clever M.D. On: 11/04/2022 04:04   Assessment & Plan:    1. Kidney stones *** - Urinalysis, Routine w reflex microscopic  2. Chronic cystitis -urine for culture Macrobid 100mg  BID for 7 days. We will likely pursue prophylaxis with macrodantin pending urine culture.    No follow-ups on file.  Wilkie Aye, MD  [facility] [place]

## 2023-03-03 ENCOUNTER — Encounter: Payer: Self-pay | Admitting: Urology

## 2023-03-20 ENCOUNTER — Institutional Professional Consult (permissible substitution): Payer: PPO | Admitting: Pulmonary Disease

## 2023-04-08 DIAGNOSIS — E118 Type 2 diabetes mellitus with unspecified complications: Secondary | ICD-10-CM | POA: Diagnosis not present

## 2023-04-08 DIAGNOSIS — E782 Mixed hyperlipidemia: Secondary | ICD-10-CM | POA: Diagnosis not present

## 2023-05-04 ENCOUNTER — Encounter: Payer: Self-pay | Admitting: Internal Medicine

## 2023-05-04 DIAGNOSIS — E782 Mixed hyperlipidemia: Secondary | ICD-10-CM | POA: Diagnosis not present

## 2023-05-04 DIAGNOSIS — K580 Irritable bowel syndrome with diarrhea: Secondary | ICD-10-CM | POA: Diagnosis not present

## 2023-05-04 DIAGNOSIS — Z853 Personal history of malignant neoplasm of breast: Secondary | ICD-10-CM | POA: Diagnosis not present

## 2023-05-04 DIAGNOSIS — I700 Atherosclerosis of aorta: Secondary | ICD-10-CM | POA: Diagnosis not present

## 2023-05-04 DIAGNOSIS — E1169 Type 2 diabetes mellitus with other specified complication: Secondary | ICD-10-CM | POA: Diagnosis not present

## 2023-05-04 DIAGNOSIS — G720 Drug-induced myopathy: Secondary | ICD-10-CM | POA: Diagnosis not present

## 2023-05-04 DIAGNOSIS — R002 Palpitations: Secondary | ICD-10-CM | POA: Diagnosis not present

## 2023-05-04 DIAGNOSIS — R945 Abnormal results of liver function studies: Secondary | ICD-10-CM | POA: Diagnosis not present

## 2023-05-04 DIAGNOSIS — I10 Essential (primary) hypertension: Secondary | ICD-10-CM | POA: Diagnosis not present

## 2023-05-04 DIAGNOSIS — G130 Paraneoplastic neuromyopathy and neuropathy: Secondary | ICD-10-CM | POA: Diagnosis not present

## 2023-05-04 DIAGNOSIS — K219 Gastro-esophageal reflux disease without esophagitis: Secondary | ICD-10-CM | POA: Diagnosis not present

## 2023-05-04 DIAGNOSIS — N202 Calculus of kidney with calculus of ureter: Secondary | ICD-10-CM | POA: Diagnosis not present

## 2023-05-27 DIAGNOSIS — Z79899 Other long term (current) drug therapy: Secondary | ICD-10-CM | POA: Diagnosis not present

## 2023-05-27 DIAGNOSIS — Z6828 Body mass index (BMI) 28.0-28.9, adult: Secondary | ICD-10-CM | POA: Diagnosis not present

## 2023-05-27 DIAGNOSIS — Z713 Dietary counseling and surveillance: Secondary | ICD-10-CM | POA: Diagnosis not present

## 2023-05-27 DIAGNOSIS — I10 Essential (primary) hypertension: Secondary | ICD-10-CM | POA: Diagnosis not present

## 2023-06-01 ENCOUNTER — Ambulatory Visit (HOSPITAL_COMMUNITY)
Admission: RE | Admit: 2023-06-01 | Discharge: 2023-06-01 | Disposition: A | Discharge status: Home / Self Care | Payer: PPO | Source: Ambulatory Visit | Attending: Urology | Admitting: Urology

## 2023-06-01 DIAGNOSIS — N132 Hydronephrosis with renal and ureteral calculous obstruction: Secondary | ICD-10-CM | POA: Diagnosis not present

## 2023-06-01 DIAGNOSIS — N200 Calculus of kidney: Secondary | ICD-10-CM | POA: Diagnosis not present

## 2023-06-18 ENCOUNTER — Telehealth: Payer: Self-pay | Admitting: Urology

## 2023-06-18 ENCOUNTER — Other Ambulatory Visit: Payer: Self-pay

## 2023-06-18 DIAGNOSIS — N200 Calculus of kidney: Secondary | ICD-10-CM

## 2023-06-18 NOTE — Telephone Encounter (Signed)
Does she need to have the CT since the US showed the kidney stones? Please call

## 2023-06-19 NOTE — Telephone Encounter (Signed)
Tried calling patient with no answer, left vm for return call 

## 2023-06-29 ENCOUNTER — Ambulatory Visit: Payer: PPO | Admitting: Urology

## 2023-07-01 DIAGNOSIS — B372 Candidiasis of skin and nail: Secondary | ICD-10-CM | POA: Diagnosis not present

## 2023-07-01 DIAGNOSIS — E782 Mixed hyperlipidemia: Secondary | ICD-10-CM | POA: Diagnosis not present

## 2023-07-01 DIAGNOSIS — Z23 Encounter for immunization: Secondary | ICD-10-CM | POA: Diagnosis not present

## 2023-07-01 DIAGNOSIS — I10 Essential (primary) hypertension: Secondary | ICD-10-CM | POA: Diagnosis not present

## 2023-07-01 DIAGNOSIS — R002 Palpitations: Secondary | ICD-10-CM | POA: Diagnosis not present

## 2023-07-03 ENCOUNTER — Ambulatory Visit (HOSPITAL_COMMUNITY)
Admission: RE | Admit: 2023-07-03 | Discharge: 2023-07-03 | Disposition: A | Discharge status: Home / Self Care | Payer: PPO | Source: Ambulatory Visit | Attending: Urology | Admitting: Urology

## 2023-07-03 DIAGNOSIS — N200 Calculus of kidney: Secondary | ICD-10-CM | POA: Insufficient documentation

## 2023-07-03 DIAGNOSIS — K5730 Diverticulosis of large intestine without perforation or abscess without bleeding: Secondary | ICD-10-CM | POA: Diagnosis not present

## 2023-07-03 DIAGNOSIS — K8689 Other specified diseases of pancreas: Secondary | ICD-10-CM | POA: Diagnosis not present

## 2023-07-10 NOTE — Telephone Encounter (Signed)
Dr. Ronne Binning is aware. Awaiting radiologist to read imaging and submit report.

## 2023-07-10 NOTE — Telephone Encounter (Signed)
Patient called she wants you to call her on cell to give her results for CT

## 2023-07-17 NOTE — Telephone Encounter (Signed)
Return call to patient. Patient is made aware that Dr. Ronne Binning is aware and awaiting radiologist to read imaging and submit report. Patient is aware that Dr. Ronne Binning is currently on vacation and once he return and review the imaging someone will reach back out with the MD response and recommendations. Patient voiced understanding.

## 2023-08-24 DIAGNOSIS — E119 Type 2 diabetes mellitus without complications: Secondary | ICD-10-CM | POA: Diagnosis not present

## 2023-08-25 DIAGNOSIS — F32A Depression, unspecified: Secondary | ICD-10-CM | POA: Diagnosis not present

## 2023-08-25 DIAGNOSIS — I469 Cardiac arrest, cause unspecified: Secondary | ICD-10-CM | POA: Diagnosis not present

## 2023-08-25 DIAGNOSIS — G300 Alzheimer's disease with early onset: Secondary | ICD-10-CM | POA: Diagnosis not present

## 2023-08-25 DIAGNOSIS — F02818 Dementia in other diseases classified elsewhere, unspecified severity, with other behavioral disturbance: Secondary | ICD-10-CM | POA: Diagnosis not present

## 2023-08-25 DIAGNOSIS — G40209 Localization-related (focal) (partial) symptomatic epilepsy and epileptic syndromes with complex partial seizures, not intractable, without status epilepticus: Secondary | ICD-10-CM | POA: Diagnosis not present

## 2023-08-26 DIAGNOSIS — E782 Mixed hyperlipidemia: Secondary | ICD-10-CM | POA: Diagnosis not present

## 2023-08-26 DIAGNOSIS — E118 Type 2 diabetes mellitus with unspecified complications: Secondary | ICD-10-CM | POA: Diagnosis not present

## 2023-08-31 ENCOUNTER — Ambulatory Visit: Payer: PPO | Admitting: Urology

## 2023-08-31 VITALS — BP 131/70 | HR 48

## 2023-08-31 DIAGNOSIS — N3000 Acute cystitis without hematuria: Secondary | ICD-10-CM

## 2023-08-31 DIAGNOSIS — Z87440 Personal history of urinary (tract) infections: Secondary | ICD-10-CM

## 2023-08-31 DIAGNOSIS — N200 Calculus of kidney: Secondary | ICD-10-CM

## 2023-08-31 DIAGNOSIS — Z87442 Personal history of urinary calculi: Secondary | ICD-10-CM | POA: Diagnosis not present

## 2023-08-31 DIAGNOSIS — R82998 Other abnormal findings in urine: Secondary | ICD-10-CM | POA: Diagnosis not present

## 2023-08-31 LAB — URINALYSIS, ROUTINE W REFLEX MICROSCOPIC
Bilirubin, UA: NEGATIVE
Glucose, UA: NEGATIVE
Nitrite, UA: NEGATIVE
Specific Gravity, UA: 1.03 (ref 1.005–1.030)
Urobilinogen, Ur: 1 mg/dL (ref 0.2–1.0)
pH, UA: 5.5 (ref 5.0–7.5)

## 2023-08-31 LAB — MICROSCOPIC EXAMINATION: RBC, Urine: >30 /[HPF] — AB (ref 0–2)

## 2023-08-31 MED ORDER — NITROFURANTOIN MACROCRYSTAL 50 MG PO CAPS: 50.0000 mg | ORAL_CAPSULE | Freq: Every day | ORAL | 5 refills | Status: DC

## 2023-08-31 MED ORDER — NITROFURANTOIN MONOHYD MACRO 100 MG PO CAPS
100.0000 mg | ORAL_CAPSULE | Freq: Two times a day (BID) | ORAL | 0 refills | Status: DC
Start: 2023-08-31 — End: 2023-11-30

## 2023-08-31 NOTE — Progress Notes (Unsigned)
08/31/2023 2:42 PM   Lauren Sullivan 1951-04-17 [id number]  Referring provider: Benita Stabile, MD [address] [place],  [place] [address]  Followup nephrolithiasis   HPI: No stone events since last visit. CT 06/2023 shows a right 2mm renal calculus.She has 2 UTIs since last visit.    PMH: Past Medical History:  Diagnosis Date   Arthritis    Blood transfusion without reported diagnosis    Breast CA (HCC)    Cataract    Diabetes mellitus without complication (HCC)    GERD (gastroesophageal reflux disease)    Hypertension     Surgical History: Past Surgical History:  Procedure Laterality Date   BIOPSY  01/02/2020   Procedure: BIOPSY;  Surgeon: Corbin Ade, MD;  [facility]: [facility];  Service: Endoscopy;;  colon   BREAST BIOPSY Left 01/19/2023   MM LT BREAST BX W LOC DEV 1ST LESION IMAGE BX SPEC STEREO GUIDE 01/19/2023 [facility]   BREAST SURGERY     CHOLECYSTECTOMY     COLONOSCOPY  2008   Dr. Jena Gauss: Minimally friable anal canal, patient only boggy edematous terminal ileum.  Biopsies from the terminal ileum were benign.  Random colon biopsies benign.  No evidence of microscopic colitis or inflammatory bowel disease.   COLONOSCOPY WITH PROPOFOL N/A 01/02/2020   Rourk: 2 tubular adenomas removed, random colon biopsies negative.  Next colonoscopy 5 years.   EYE SURGERY     MASTECTOMY Right    POLYPECTOMY  01/02/2020   Procedure: POLYPECTOMY;  Surgeon: Corbin Ade, MD;  [facility]: [facility];  Service: Endoscopy;;   TUBAL LIGATION      Home Medications:  Allergies as of 08/31/2023       Reactions   Morphine Nausea And Vomiting   Tape Dermatitis   Paper tape "breaks me out"   Vancomycin Itching, Rash        Medication List        Accurate as of August 31, 2023  2:42 PM. If you have any questions, ask your nurse or doctor.          acetaminophen 500 MG tablet Commonly known as: TYLENOL Take 1,000 mg by mouth every 6 (six) hours as  needed for headache.   amLODipine 5 MG tablet Commonly known as: NORVASC Take 10 mg by mouth daily.   celecoxib 100 MG capsule Commonly known as: CELEBREX Take 100 mg by mouth daily.   ezetimibe 10 MG tablet Commonly known as: ZETIA Take 10 mg by mouth daily.   fluconazole 150 MG tablet Commonly known as: DIFLUCAN Take 150 mg by mouth. As needed   metFORMIN 500 MG 24 hr tablet Commonly known as: GLUCOPHAGE-XR Take 500 mg by mouth 2 (two) times daily.   metoprolol tartrate 100 MG tablet Commonly known as: LOPRESSOR Take 1 tablet (100 mg total) by mouth 2 (two) times daily. What changed: when to take this   nitrofurantoin (macrocrystal-monohydrate) 100 MG capsule Commonly known as: MACROBID Take 1 capsule (100 mg total) by mouth every 12 (twelve) hours.   nortriptyline 50 MG capsule Commonly known as: PAMELOR Take 50 mg by mouth daily.   pantoprazole 40 MG tablet Commonly known as: PROTONIX Take 40 mg by mouth daily.   Rybelsus 14 MG Tabs Generic drug: Semaglutide Take 1 tablet by mouth every morning.        Allergies:  Allergies  Allergen Reactions   Morphine Nausea And Vomiting   Tape Dermatitis    Paper  tape "breaks me out"   Vancomycin Itching and Rash    Family History: Family History  Problem Relation Age of Onset   Cancer Mother        pancreatic   Heart disease Mother    Hypertension Mother    Hypertension Father    Colon cancer Neg Hx     Social History:  reports that she has quit smoking. She has never been exposed to tobacco smoke. She has never used smokeless tobacco. She reports that she does not drink alcohol and does not use drugs.  ROS: All other review of systems were reviewed and are negative except what is noted above in HPI  Physical Exam: BP 131/70   Pulse (!) 48   Constitutional:  Alert and oriented, No acute distress. HEENT: [place] AT, moist mucus membranes.  Trachea midline, no masses. Cardiovascular: No clubbing,  cyanosis, or edema. Respiratory: Normal respiratory effort, no increased work of breathing. GI: Abdomen is soft, nontender, nondistended, no abdominal masses GU: No CVA tenderness.  Lymph: No cervical or inguinal lymphadenopathy. Skin: No rashes, bruises or suspicious lesions. Neurologic: Grossly intact, no focal deficits, moving all 4 extremities. Psychiatric: Normal mood and affect.  Laboratory Data: Lab Results  Component Value Date   WBC 12.8 (H) 11/04/2022   HGB 14.5 11/04/2022   HCT 44.5 11/04/2022   MCV 88.6 11/04/2022   PLT 297 11/04/2022    Lab Results  Component Value Date   CREATININE 0.93 11/04/2022    No results found for: "PSA"  No results found for: "TESTOSTERONE"  Lab Results  Component Value Date   HGBA1C 6.2 (A) 08/22/2019    Urinalysis    Component Value Date/Time   COLORURINE YELLOW 11/04/2022 0250   APPEARANCEUR Clear 03/02/2023 1410   LABSPEC 1.025 11/04/2022 0250   PHURINE 6.0 11/04/2022 0250   GLUCOSEU Negative 03/02/2023 1410   HGBUR LARGE (A) 11/04/2022 0250   BILIRUBINUR Negative 03/02/2023 1410   KETONESUR NEGATIVE 11/04/2022 0250   PROTEINUR Negative 03/02/2023 1410   PROTEINUR NEGATIVE 11/04/2022 0250   UROBILINOGEN 0.2 02/08/2011 1638   NITRITE Negative 03/02/2023 1410   NITRITE NEGATIVE 11/04/2022 0250   LEUKOCYTESUR 2+ (A) 03/02/2023 1410   LEUKOCYTESUR NEGATIVE 11/04/2022 0250    Lab Results  Component Value Date   LABMICR See below: 03/02/2023   WBCUA 11-30 (A) 03/02/2023   LABEPIT 0-10 03/02/2023   BACTERIA Few (A) 03/02/2023    Pertinent Imaging: *** Results for orders placed in visit on 12/12/22  DG Abd 1 View  Narrative CLINICAL DATA:  Kidney stone  EXAM: ABDOMEN - 1 VIEW  COMPARISON:  CT abdomen pelvis 11/04/2022  FINDINGS: No definite urinary tract calcification identified.  Specifically, tiny proximal LEFT ureteral calculus seen on prior CT not identified.  Tiny pelvic phleboliths.  Bone  island LEFT sacrum unchanged.  Bones demineralized with degenerative disc disease changes lumbar spine.  Surgical clips RIGHT upper quadrant question cholecystectomy.  Bowel gas pattern normal.  IMPRESSION: No definite urinary tract calcifications identified.   Electronically Signed By: Ulyses Southward M.D. On: 12/12/2022 17:09  No results found for this or any previous visit.  No results found for this or any previous visit.  No results found for this or any previous visit.  Results for orders placed during the hospital encounter of 06/01/23  Ultrasound renal complete  Narrative CLINICAL DATA:  Chronic cystitis.  Follow-up nephrolithiasis.  EXAM: RENAL / URINARY TRACT ULTRASOUND COMPLETE  COMPARISON:  Feb 18, 2023  FINDINGS:  Right Kidney:  Renal measurements: 10.8 x 4.5 x 5.1 cm = volume: 129.5 mL. Echogenicity within normal limits. No mass or hydronephrosis visualized.  Left Kidney:  Renal measurements: 10.5 x 4.8 x 5.6 cm = volume: 147 mL. 6.8 mm stone. Echogenicity within normal limits. No mass visualized. Mild hydronephrosis.  Bladder:  Appears normal for degree of bladder distention.  Other:  None.  IMPRESSION: Mild left hydronephrosis. 6.8 mm left renal stone.   Electronically Signed By: Sherian Rein M.D. On: 06/01/2023 13:47  No valid procedures specified. No results found for this or any previous visit.  Results for orders placed in visit on 06/18/23  CT RENAL STONE STUDY  Narrative CLINICAL DATA:  Symptomatic urolithiasis  EXAM: CT ABDOMEN AND PELVIS WITHOUT CONTRAST  TECHNIQUE: Multidetector CT imaging of the abdomen and pelvis was performed following the standard protocol without IV contrast.  RADIATION DOSE REDUCTION: This exam was performed according to the departmental dose-optimization program which includes automated exposure control, adjustment of the mA and/or kV according to patient size and/or use of iterative  reconstruction technique.  COMPARISON:  11/04/2022  FINDINGS: Lower chest: Right mastectomy.  Hepatobiliary: Cholecystectomy.  Otherwise unremarkable.  Pancreas: Stable fatty atrophy of the pancreas.  Spleen: Unremarkable  Adrenals/Urinary Tract: Both adrenal glands appear normal.  2 mm right kidney lower pole nonobstructive renal calculus. No other urinary tract calculi are identified.  Stomach/Bowel: Scattered diverticula of the descending and sigmoid colon.  Vascular/Lymphatic: Atherosclerosis is present, including aortoiliac atherosclerotic disease.  Reproductive: Unremarkable  Other: No supplemental non-categorized findings.  Musculoskeletal: Lumbar spondylosis and degenerative disc disease causing multilevel impingement.  IMPRESSION: 1. 2 mm right kidney lower pole nonobstructive renal calculus. No other urinary tract calculi are identified. 2. Lumbar spondylosis and degenerative disc disease causing multilevel impingement. 3. Aortoiliac atherosclerosis.  Aortic Atherosclerosis (ICD10-I70.0).   Electronically Signed By: Gaylyn Rong M.D. On: 07/17/2023 10:36   Assessment & Plan:    1. Kidney stones *** - Urinalysis, Routine w reflex microscopic  2. Acute cystitis without hematuria *** - Urine Culture   No follow-ups on file.  Wilkie Aye, MD  [facility] [place]

## 2023-09-01 ENCOUNTER — Encounter: Payer: Self-pay | Admitting: Urology

## 2023-09-01 NOTE — Patient Instructions (Signed)

## 2023-09-02 DIAGNOSIS — G130 Paraneoplastic neuromyopathy and neuropathy: Secondary | ICD-10-CM | POA: Diagnosis not present

## 2023-09-02 DIAGNOSIS — E782 Mixed hyperlipidemia: Secondary | ICD-10-CM | POA: Diagnosis not present

## 2023-09-02 DIAGNOSIS — N202 Calculus of kidney with calculus of ureter: Secondary | ICD-10-CM | POA: Diagnosis not present

## 2023-09-02 DIAGNOSIS — I10 Essential (primary) hypertension: Secondary | ICD-10-CM | POA: Diagnosis not present

## 2023-09-02 DIAGNOSIS — R002 Palpitations: Secondary | ICD-10-CM | POA: Diagnosis not present

## 2023-09-02 DIAGNOSIS — K219 Gastro-esophageal reflux disease without esophagitis: Secondary | ICD-10-CM | POA: Diagnosis not present

## 2023-09-02 DIAGNOSIS — E1169 Type 2 diabetes mellitus with other specified complication: Secondary | ICD-10-CM | POA: Diagnosis not present

## 2023-09-02 DIAGNOSIS — K719 Toxic liver disease, unspecified: Secondary | ICD-10-CM | POA: Diagnosis not present

## 2023-09-02 DIAGNOSIS — K580 Irritable bowel syndrome with diarrhea: Secondary | ICD-10-CM | POA: Diagnosis not present

## 2023-09-02 DIAGNOSIS — G720 Drug-induced myopathy: Secondary | ICD-10-CM | POA: Diagnosis not present

## 2023-09-02 DIAGNOSIS — Z853 Personal history of malignant neoplasm of breast: Secondary | ICD-10-CM | POA: Diagnosis not present

## 2023-09-02 DIAGNOSIS — R945 Abnormal results of liver function studies: Secondary | ICD-10-CM | POA: Diagnosis not present

## 2023-09-07 DIAGNOSIS — H26491 Other secondary cataract, right eye: Secondary | ICD-10-CM | POA: Diagnosis not present

## 2023-09-07 DIAGNOSIS — H35371 Puckering of macula, right eye: Secondary | ICD-10-CM | POA: Diagnosis not present

## 2023-09-21 DIAGNOSIS — H26492 Other secondary cataract, left eye: Secondary | ICD-10-CM | POA: Diagnosis not present

## 2023-09-28 ENCOUNTER — Telehealth: Payer: Self-pay | Admitting: Internal Medicine

## 2023-09-28 NOTE — Telephone Encounter (Signed)
Patient wants a provider switch from Dr. Jenene Slicker in [place] to Dr. Anne Fu in [place].

## 2023-10-26 ENCOUNTER — Other Ambulatory Visit: Payer: Self-pay | Admitting: *Deleted

## 2023-10-26 ENCOUNTER — Ambulatory Visit: Payer: PPO | Attending: Cardiology | Admitting: Cardiology

## 2023-10-26 ENCOUNTER — Encounter: Payer: Self-pay | Admitting: Cardiology

## 2023-10-26 VITALS — BP 170/86 | HR 72 | Ht 65.5 in | Wt 190.8 lb

## 2023-10-26 DIAGNOSIS — R002 Palpitations: Secondary | ICD-10-CM

## 2023-10-26 DIAGNOSIS — Z01812 Encounter for preprocedural laboratory examination: Secondary | ICD-10-CM

## 2023-10-26 DIAGNOSIS — I10 Essential (primary) hypertension: Secondary | ICD-10-CM

## 2023-10-26 DIAGNOSIS — E119 Type 2 diabetes mellitus without complications: Secondary | ICD-10-CM | POA: Diagnosis not present

## 2023-10-26 DIAGNOSIS — R072 Precordial pain: Secondary | ICD-10-CM

## 2023-10-26 MED ORDER — METOPROLOL TARTRATE 50 MG PO TABS: 50.0000 mg | ORAL_TABLET | Freq: Two times a day (BID) | ORAL | 3 refills | Status: DC

## 2023-10-26 NOTE — Patient Instructions (Signed)
 Medication Instructions:  Please decrease your Metoprolol  Tartrate to 50 mg twice a day. Continue all other medications as listed.  *If you need a refill on your cardiac medications before your next appointment, please call your pharmacy*   Lab Work: Please have blood work today (BMP)  If you have labs (blood work) drawn today and your tests are completely normal, you will receive your results only by: MyChart Message (if you have MyChart) OR A paper copy in the mail If you have any lab test that is abnormal or we need to change your treatment, we will call you to review the results.   Testing/Procedures:   Your cardiac CT will be scheduled at:   [facility] [address] [place], [place] [address] [phone]  Please arrive at the [facility] (Entrance C2) of [facility] 30 minutes prior to test start time. You can use the FREE valet parking offered at entrance C (encouraged to control the heart rate for the test)  Proceed to the [facility] Radiology Department (first floor) to check-in and test prep.  All radiology patients and guests should use entrance C2 at [facility], accessed from [facility], even though the hospital's physical address listed is [address].    Please follow these instructions carefully (unless otherwise directed):  An IV will be required for this test and Nitroglycerin  will be given.  Hold all erectile dysfunction medications at least 3 days (72 hrs) prior to test. (Ie viagra, cialis, sildenafil, tadalafil, etc)   On the Night Before the Test: Be sure to Drink plenty of water. Do not consume any caffeinated/decaffeinated beverages or chocolate 12 hours prior to your test. Do not take any antihistamines 12 hours prior to your test.  On the Day of the Test: Drink plenty of water until 1 hour prior to the test. Do not eat any food 1 hour prior to test. You may take your  regular medications prior to the test.  Take metoprolol  (Lopressor ) two hours prior to test. If you take Furosemide/Hydrochlorothiazide/Spironolactone/Chlorthalidone, please HOLD on the morning of the test. Patients who wear a continuous glucose monitor MUST remove the device prior to scanning. FEMALES- please wear underwire-free bra if available, avoid dresses & tight clothing  After the Test: Drink plenty of water. After receiving IV contrast, you may experience a mild flushed feeling. This is normal. On occasion, you may experience a mild rash up to 24 hours after the test. This is not dangerous. If this occurs, you can take Benadryl  25 mg and increase your fluid intake. If you experience trouble breathing, this can be serious. If it is severe call 911 IMMEDIATELY. If it is mild, please call our office.  We will call to schedule your test 2-4 weeks out understanding that some insurance companies will need an authorization prior to the service being performed.   For more information and frequently asked questions, please visit our website : [email or web address]  For non-scheduling related questions, please contact the cardiac imaging nurse navigator should you have any questions/concerns: Cardiac Imaging Nurse Navigators Direct Office Dial: [phone]   For scheduling needs, including cancellations and rescheduling, please call Brittany, [phone].  Follow-Up: At [facility], you and your health needs are our priority.  As part of our continuing mission to provide you with exceptional heart care, we have created designated Provider Care Teams.  These Care Teams include your primary Cardiologist (physician) and Advanced Practice Providers (APPs -  Physician Assistants and Nurse Practitioners) who all work together to provide you with the care you need, when you need it.  We recommend signing up for the patient portal called MyChart.  Sign up information is  provided on this After Visit Summary.  MyChart is used to connect with patients for Virtual Visits (Telemedicine).  Patients are able to view lab/test results, encounter notes, upcoming appointments, etc.  Non-urgent messages can be sent to your provider as well.   To learn more about what you can do with MyChart, go to [email or web address].    Your next appointment:   Follow up will be based on the results of the above testing.

## 2023-10-26 NOTE — Progress Notes (Signed)
 Cardiology Office Note:  .   Date:  10/26/2023  ID:  Macario DELENA Moats, DOB [birth year], MRN [id number] PCP: Shona Norleen PEDLAR, MD  [facility] Providers Cardiologist:  None     History of Present Illness: .   Lauren Sullivan is a 73 y.o. female Discussed with the use of AI scribe   History of Present Illness   The patient, a 73 year old with a history of hypertension, diabetes, recurrent breast cancer, and obstructive sleep apnea, presents for a follow-up visit. She has a known diagnosis of mitral valve prolapse and has been experiencing palpitations lasting from a few minutes to a couple of hours. An event monitor conducted on 02/17/23 showed four pauses ranging from 3 to 3.3 seconds during wakeful hours, but the patient was asymptomatic with less than 1% PACs and PVCs. An echocardiogram on 02/05/23 showed a grossly normal mitral valve and an ejection fraction of 65%.  The patient reports frequent fatigue and a heart rate that often drops into the 30s, which she monitors with a CardiaMobile device. She also reports occasional sharp chest pain that resolves quickly but recurs intermittently. The patient denies any history of smoking or alcohol use but does consume diet Dr. Nunzio regularly.  The patient is currently on Zetia for cholesterol management, metoprolol  (100mg  in the morning and 50mg  in the evening), and amlodipine (10mg  daily) for blood pressure control. She reports a history of feeling like she was going to black out, which improved when the metoprolol  was reduced to 1.5 pills daily due to weight loss.  The patient also reports a fall in April or May, after which she began experiencing the low heart rate and fatigue. She denies any loss of consciousness associated with the fall. The patient's blood pressure tends to run high, and she has been on metoprolol  for several years.           Studies Reviewed: .        Results   DIAGNOSTIC Event monitor: Four pauses ranging from 3 to 3.3  seconds during wakeful hours, asymptomatic with less than 1% PACs and less than 1% PVCs (02/17/2023) Echocardiogram: Mitral valve grossly normal, ejection fraction 65%, grade 1 diastolic dysfunction, left atrial dilation 36 mm (02/05/2023)     Risk Assessment/Calculations:        Physical Exam:   VS:  BP (!) 170/86   Pulse 72   Ht 5' 5.5 (1.664 m)   Wt 190 lb 12.8 oz (86.5 kg)   SpO2 95%   BMI 31.27 kg/m    Wt Readings from Last 3 Encounters:  10/26/23 190 lb 12.8 oz (86.5 kg)  01/08/23 188 lb (85.3 kg)  11/04/22 185 lb (83.9 kg)    GEN: Well nourished, well developed in no acute distress NECK: No JVD; No carotid bruits CARDIAC: RRR, no murmurs, no rubs, no gallops RESPIRATORY:  Clear to auscultation without rales, wheezing or rhonchi  ABDOMEN: Soft, non-tender, non-distended EXTREMITIES:  No edema; No deformity   ASSESSMENT AND PLAN: .    Assessment and Plan    Chest Pain Intermittent sharp chest pain, possibly due to coronary artery disease, musculoskeletal issues, or other non-cardiac etiologies. Previous echocardiogram showed good heart function (ejection fraction 65%). Discussed coronary CT scan to evaluate for potential CAD. - Order coronary CT scan  Bradycardia Heart rate frequently dropping into the 30s, causing fatigue. Currently on metoprolol , which can contribute to bradycardia. Recent event monitor showed asymptomatic pauses. Discussed reducing metoprolol  dose to mitigate bradycardia. -  Reduce metoprolol  dose to 50 mg twice a day - Monitor heart rate and symptoms - Consider further reduction or discontinuation of metoprolol  if bradycardia persists  Hypertension Blood pressure tends to run high. Currently managed with metoprolol  and amlodipine 10 mg daily. Metoprolol  is not the most effective antihypertensive. Discussed alternative antihypertensive medications with primary care physician, including ARB, HCT.  - Continue amlodipine 10 mg daily  Mitral Valve  Prolapse Diagnosed many years ago. Recent echocardiogram showed a grossly normal mitral valve. - Continue routine monitoring as per primary care physician's recommendations  Diabetes Mellitus Chronic condition, not discussed in detail during this visit. - Continue current management as per primary care physician's recommendations  Obstructive Sleep Apnea Diagnosed but does not require treatment. - Continue monitoring for any changes in symptoms  General Health Maintenance Does not smoke or drink alcohol. Diet includes diet soda. - Encourage a balanced diet and regular exercise  Follow-up - Set up coronary CT scan - Follow up with primary care physician for blood pressure management               Signed, Oneil Parchment, MD

## 2023-10-27 LAB — BASIC METABOLIC PANEL
BUN/Creatinine Ratio: 14 (ref 12–28)
BUN: 11 mg/dL (ref 8–27)
CO2: 26 mmol/L (ref 20–29)
Calcium: 9.6 mg/dL (ref 8.7–10.3)
Chloride: 105 mmol/L (ref 96–106)
Creatinine, Ser: 0.76 mg/dL (ref 0.57–1.00)
Glucose: 86 mg/dL (ref 70–99)
Potassium: 3.9 mmol/L (ref 3.5–5.2)
Sodium: 139 mmol/L (ref 134–144)
eGFR: 84 mL/min/{1.73_m2} (ref >59)

## 2023-11-03 ENCOUNTER — Telehealth (HOSPITAL_COMMUNITY): Payer: Self-pay | Admitting: *Deleted

## 2023-11-03 ENCOUNTER — Telehealth (HOSPITAL_COMMUNITY): Payer: Self-pay | Admitting: Emergency Medicine

## 2023-11-03 NOTE — Telephone Encounter (Signed)
 Attempted to call patient regarding upcoming cardiac CT appointment. Left message on voicemail with name and callback number Rockwell Alexandria RN Navigator Cardiac Imaging [facility] [phone] Office [phone] Cell

## 2023-11-03 NOTE — Telephone Encounter (Signed)
 Patient returning call about her upcoming cardiac imaging study; pt verbalizes understanding of appt date/time, parking situation and where to check in, pre-test NPO status and medications ordered, and verified current allergies; name and call back number provided for further questions should they arise  Lauren Requena RN Navigator Cardiac Imaging [facility] [phone] office [phone] cell  Patient to take her daily metoprolol . She is aware to arrive at 2 PM.

## 2023-11-04 ENCOUNTER — Ambulatory Visit (HOSPITAL_COMMUNITY)
Admission: RE | Admit: 2023-11-04 | Discharge: 2023-11-04 | Disposition: A | Discharge status: Home / Self Care | Payer: PPO | Source: Ambulatory Visit | Attending: Cardiology | Admitting: Cardiology

## 2023-11-04 DIAGNOSIS — R072 Precordial pain: Secondary | ICD-10-CM | POA: Insufficient documentation

## 2023-11-04 MED ORDER — IOHEXOL 350 MG/ML SOLN
100.0000 mL | Freq: Once | INTRAVENOUS | Status: AC | PRN
  Administered 2023-11-04: 100 mL via INTRAVENOUS

## 2023-11-04 MED ORDER — NITROGLYCERIN 0.4 MG SL SUBL
SUBLINGUAL_TABLET | SUBLINGUAL | Status: AC
  Filled 2023-11-04: qty 2

## 2023-11-04 MED ORDER — NITROGLYCERIN 0.4 MG SL SUBL: 0.8000 mg | SUBLINGUAL_TABLET | Freq: Once | SUBLINGUAL | Status: DC

## 2023-11-30 ENCOUNTER — Ambulatory Visit: Payer: PPO | Admitting: Urology

## 2023-11-30 VITALS — BP 159/69 | HR 41

## 2023-11-30 DIAGNOSIS — N200 Calculus of kidney: Secondary | ICD-10-CM

## 2023-11-30 DIAGNOSIS — R82998 Other abnormal findings in urine: Secondary | ICD-10-CM | POA: Diagnosis not present

## 2023-11-30 DIAGNOSIS — R310 Gross hematuria: Secondary | ICD-10-CM | POA: Diagnosis not present

## 2023-11-30 DIAGNOSIS — Z87442 Personal history of urinary calculi: Secondary | ICD-10-CM

## 2023-11-30 DIAGNOSIS — Z87440 Personal history of urinary (tract) infections: Secondary | ICD-10-CM | POA: Diagnosis not present

## 2023-11-30 DIAGNOSIS — N3000 Acute cystitis without hematuria: Secondary | ICD-10-CM | POA: Diagnosis not present

## 2023-11-30 DIAGNOSIS — N390 Urinary tract infection, site not specified: Secondary | ICD-10-CM

## 2023-11-30 LAB — MICROSCOPIC EXAMINATION: Bacteria, UA: NONE SEEN

## 2023-11-30 LAB — URINALYSIS, ROUTINE W REFLEX MICROSCOPIC
Bilirubin, UA: NEGATIVE
Glucose, UA: NEGATIVE
Ketones, UA: NEGATIVE
Nitrite, UA: NEGATIVE
RBC, UA: NEGATIVE
Specific Gravity, UA: 1.03 (ref 1.005–1.030)
Urobilinogen, Ur: 1 mg/dL (ref 0.2–1.0)
pH, UA: 6 (ref 5.0–7.5)

## 2023-11-30 MED ORDER — NITROFURANTOIN MACROCRYSTAL 50 MG PO CAPS: 50.0000 mg | ORAL_CAPSULE | Freq: Every day | ORAL | 3 refills | Status: DC

## 2023-11-30 MED ORDER — SILODOSIN 8 MG PO CAPS: 8.0000 mg | ORAL_CAPSULE | Freq: Every day | ORAL | 11 refills | Status: DC

## 2023-11-30 NOTE — Progress Notes (Deleted)
   11/30/23  CC: No chief complaint on file.   HPI:  Blood pressure (!) 159/69, pulse (!) 41. NED. A&Ox3.   No respiratory distress   Abd soft, NT, ND Normal phallus with bilateral descended testicles  Cystoscopy Procedure Note  Patient identification was confirmed, informed consent was obtained, and patient was prepped using Betadine solution.  Lidocaine  jelly was administered per urethral meatus.     Pre-Procedure: - Inspection reveals a normal caliber ureteral meatus.  Procedure: The flexible cystoscope was introduced without difficulty - No urethral strictures/lesions are present. - {Blank multiple:19197::"Enlarged","Surgically absent","Normal"} prostate no median lobe - {Blank multiple:19197::"Normal","Elevated","Tight"} bladder neck - Bilateral ureteral orifices identified - Bladder mucosa  reveals no ulcers, tumors, or lesions - No bladder stones - No trabeculation  Retroflexion shows ***   Post-Procedure: - Patient tolerated the procedure well  Assessment/ Plan: We discussed the management of his BPH including continued medical therapy, Rezum, Urolift, TURP and simple prostatectomy. After discussing the options the patient has elected to proceed with medical therapy. We will trial rapalfo 8mg  daily. Risks/benefits/alternatives discussed.   No follow-ups on file.  Johnie Nailer, MD

## 2023-11-30 NOTE — Progress Notes (Signed)
11/30/2023 9:26 AM   Lauren Sullivan November 02, 1950 [id number]  Referring provider: Benita Stabile, MD [address] [place],  [place] [address]  Followup nephrolithiasis   HPI: Lauren Sullivan is a 72yo here for followup for nephrolithiasis. She drinks 48oz per day. She passed a small calculus 1 month ago. She denies nay flank pain currently. She denies any infections since last visit, She is on macrodantin prophylaxis. UA today is concerning for infection. No other complaints today. She denies nay worsening LUTS    PMH: Past Medical History:  Diagnosis Date   Arthritis    Blood transfusion without reported diagnosis    Breast CA (HCC)    Cataract    Diabetes mellitus without complication (HCC)    GERD (gastroesophageal reflux disease)    Hypertension     Surgical History: Past Surgical History:  Procedure Laterality Date   BIOPSY  01/02/2020   Procedure: BIOPSY;  Surgeon: Corbin Ade, MD;  [facility];  Service: Endoscopy;;  colon   BREAST BIOPSY Left 01/19/2023   MM LT BREAST BX W LOC DEV 1ST LESION IMAGE BX SPEC STEREO GUIDE 01/19/2023 [facility]   BREAST SURGERY     CHOLECYSTECTOMY     COLONOSCOPY  2008   Dr. Jena Gauss: Minimally friable anal canal, patient only boggy edematous terminal ileum.  Biopsies from the terminal ileum were benign.  Random colon biopsies benign.  No evidence of microscopic colitis or inflammatory bowel disease.   COLONOSCOPY WITH PROPOFOL N/A 01/02/2020   Rourk: 2 tubular adenomas removed, random colon biopsies negative.  Next colonoscopy 5 years.   EYE SURGERY     MASTECTOMY Right    POLYPECTOMY  01/02/2020   Procedure: POLYPECTOMY;  Surgeon: Corbin Ade, MD;  [facility];  Service: Endoscopy;;   TUBAL LIGATION      Home Medications:  Allergies as of 11/30/2023       Reactions   Morphine Nausea And Vomiting   Tape Dermatitis   Paper tape "breaks me out"   Vancomycin Itching, Rash        Medication List         Accurate as of November 30, 2023  9:26 AM. If you have any questions, ask your nurse or doctor.          STOP taking these medications    metFORMIN 500 MG 24 hr tablet Commonly known as: GLUCOPHAGE-XR   nitrofurantoin (macrocrystal-monohydrate) 100 MG capsule Commonly known as: MACROBID       TAKE these medications    acetaminophen 500 MG tablet Commonly known as: TYLENOL Take 1,000 mg by mouth every 6 (six) hours as needed for headache.   amLODipine 5 MG tablet Commonly known as: NORVASC Take 10 mg by mouth daily.   celecoxib 100 MG capsule Commonly known as: CELEBREX Take 100 mg by mouth daily.   ezetimibe 10 MG tablet Commonly known as: ZETIA Take 10 mg by mouth daily.   fluconazole 150 MG tablet Commonly known as: DIFLUCAN Take 150 mg by mouth. As needed   metoprolol tartrate 50 MG tablet Commonly known as: LOPRESSOR Take 1 tablet (50 mg total) by mouth 2 (two) times daily.   nitrofurantoin 50 MG capsule Commonly known as: Macrodantin Take 1 capsule (50 mg total) by mouth at bedtime.   nortriptyline 50 MG capsule Commonly known as: PAMELOR Take 50 mg by mouth daily.   pantoprazole 40 MG tablet Commonly known as: PROTONIX Take 40 mg  by mouth daily.   Rybelsus 14 MG Tabs Generic drug: Semaglutide Take 1 tablet by mouth every morning.        Allergies:  Allergies  Allergen Reactions   Morphine Nausea And Vomiting   Tape Dermatitis    Paper tape "breaks me out"   Vancomycin Itching and Rash    Family History: Family History  Problem Relation Age of Onset   Cancer Mother        pancreatic   Heart disease Mother    Hypertension Mother    Hypertension Father    Colon cancer Neg Hx     Social History:  reports that she has quit smoking. She has never been exposed to tobacco smoke. She has never used smokeless tobacco. She reports that she does not drink alcohol and does not use drugs.  ROS: All other review of systems were  reviewed and are negative except what is noted above in HPI  Physical Exam: BP (!) 159/69   Pulse (!) 41 Comment: PT STATES HER HR NORMALLY IS IN THE 30'S  Constitutional:  Alert and oriented, No acute distress. HEENT: [place] AT, moist mucus membranes.  Trachea midline, no masses. Cardiovascular: No clubbing, cyanosis, or edema. Respiratory: Normal respiratory effort, no increased work of breathing. GI: Abdomen is soft, nontender, nondistended, no abdominal masses GU: No CVA tenderness.  Lymph: No cervical or inguinal lymphadenopathy. Skin: No rashes, bruises or suspicious lesions. Neurologic: Grossly intact, no focal deficits, moving all 4 extremities. Psychiatric: Normal mood and affect.  Laboratory Data: Lab Results  Component Value Date   WBC 12.8 (H) 11/04/2022   HGB 14.5 11/04/2022   HCT 44.5 11/04/2022   MCV 88.6 11/04/2022   PLT 297 11/04/2022    Lab Results  Component Value Date   CREATININE 0.76 10/26/2023    No results found for: "PSA"  No results found for: "TESTOSTERONE"  Lab Results  Component Value Date   HGBA1C 6.2 (A) 08/22/2019    Urinalysis    Component Value Date/Time   COLORURINE YELLOW 11/04/2022 0250   APPEARANCEUR Clear 08/31/2023 1431   LABSPEC 1.025 11/04/2022 0250   PHURINE 6.0 11/04/2022 0250   GLUCOSEU Negative 08/31/2023 1431   HGBUR LARGE (A) 11/04/2022 0250   BILIRUBINUR Negative 08/31/2023 1431   KETONESUR NEGATIVE 11/04/2022 0250   PROTEINUR 2+ (A) 08/31/2023 1431   PROTEINUR NEGATIVE 11/04/2022 0250   UROBILINOGEN 0.2 02/08/2011 1638   NITRITE Negative 08/31/2023 1431   NITRITE NEGATIVE 11/04/2022 0250   LEUKOCYTESUR 1+ (A) 08/31/2023 1431   LEUKOCYTESUR NEGATIVE 11/04/2022 0250    Lab Results  Component Value Date   LABMICR See below: 08/31/2023   WBCUA 11-30 (A) 08/31/2023   LABEPIT 0-10 08/31/2023   MUCUS Present (A) 08/31/2023   BACTERIA Moderate (A) 08/31/2023    Pertinent Imaging:  Results for orders placed in  visit on 12/12/22  DG Abd 1 View  Narrative CLINICAL DATA:  Kidney stone  EXAM: ABDOMEN - 1 VIEW  COMPARISON:  CT abdomen pelvis 11/04/2022  FINDINGS: No definite urinary tract calcification identified.  Specifically, tiny proximal LEFT ureteral calculus seen on prior CT not identified.  Tiny pelvic phleboliths.  Bone island LEFT sacrum unchanged.  Bones demineralized with degenerative disc disease changes lumbar spine.  Surgical clips RIGHT upper quadrant question cholecystectomy.  Bowel gas pattern normal.  IMPRESSION: No definite urinary tract calcifications identified.   Electronically Signed By: Ulyses Southward M.D. On: 12/12/2022 17:09  No results found for this or any  previous visit.  No results found for this or any previous visit.  No results found for this or any previous visit.  Results for orders placed during the hospital encounter of 06/01/23  Ultrasound renal complete  Narrative CLINICAL DATA:  Chronic cystitis.  Follow-up nephrolithiasis.  EXAM: RENAL / URINARY TRACT ULTRASOUND COMPLETE  COMPARISON:  Feb 18, 2023  FINDINGS: Right Kidney:  Renal measurements: 10.8 x 4.5 x 5.1 cm = volume: 129.5 mL. Echogenicity within normal limits. No mass or hydronephrosis visualized.  Left Kidney:  Renal measurements: 10.5 x 4.8 x 5.6 cm = volume: 147 mL. 6.8 mm stone. Echogenicity within normal limits. No mass visualized. Mild hydronephrosis.  Bladder:  Appears normal for degree of bladder distention.  Other:  None.  IMPRESSION: Mild left hydronephrosis. 6.8 mm left renal stone.   Electronically Signed By: Sherian Rein M.D. On: 06/01/2023 13:47  No results found for this or any previous visit.  No results found for this or any previous visit.  Results for orders placed in visit on 06/18/23  CT RENAL STONE STUDY  Narrative CLINICAL DATA:  Symptomatic urolithiasis  EXAM: CT ABDOMEN AND PELVIS WITHOUT  CONTRAST  TECHNIQUE: Multidetector CT imaging of the abdomen and pelvis was performed following the standard protocol without IV contrast.  RADIATION DOSE REDUCTION: This exam was performed according to the departmental dose-optimization program which includes automated exposure control, adjustment of the mA and/or kV according to patient size and/or use of iterative reconstruction technique.  COMPARISON:  11/04/2022  FINDINGS: Lower chest: Right mastectomy.  Hepatobiliary: Cholecystectomy.  Otherwise unremarkable.  Pancreas: Stable fatty atrophy of the pancreas.  Spleen: Unremarkable  Adrenals/Urinary Tract: Both adrenal glands appear normal.  2 mm right kidney lower pole nonobstructive renal calculus. No other urinary tract calculi are identified.  Stomach/Bowel: Scattered diverticula of the descending and sigmoid colon.  Vascular/Lymphatic: Atherosclerosis is present, including aortoiliac atherosclerotic disease.  Reproductive: Unremarkable  Other: No supplemental non-categorized findings.  Musculoskeletal: Lumbar spondylosis and degenerative disc disease causing multilevel impingement.  IMPRESSION: 1. 2 mm right kidney lower pole nonobstructive renal calculus. No other urinary tract calculi are identified. 2. Lumbar spondylosis and degenerative disc disease causing multilevel impingement. 3. Aortoiliac atherosclerosis.  Aortic Atherosclerosis (ICD10-I70.0).   Electronically Signed By: Gaylyn Rong M.D. On: 07/17/2023 10:36   Assessment & Plan:    1. nephrolithiasis Followup 3 months with a renal US - Urinalysis, Routine w reflex microscopic  2. Frequent UTI -continue macrodantin prophylaxis   No follow-ups on file.  Wilkie Aye, MD  [facility] [place]

## 2023-12-02 LAB — URINE CULTURE: Organism ID, Bacteria: NO GROWTH

## 2023-12-08 ENCOUNTER — Encounter: Payer: Self-pay | Admitting: Urology

## 2023-12-08 NOTE — Patient Instructions (Signed)

## 2024-01-04 DIAGNOSIS — E782 Mixed hyperlipidemia: Secondary | ICD-10-CM | POA: Diagnosis not present

## 2024-01-04 DIAGNOSIS — E118 Type 2 diabetes mellitus with unspecified complications: Secondary | ICD-10-CM | POA: Diagnosis not present

## 2024-01-05 LAB — LAB REPORT - SCANNED
A1c: 5.8
Albumin, Urine POC: 14.1
Albumin/Creatinine Ratio, Urine, POC: 10
Creatinine, POC: 146.2 mg/dL
EGFR: 86

## 2024-01-11 DIAGNOSIS — I10 Essential (primary) hypertension: Secondary | ICD-10-CM | POA: Diagnosis not present

## 2024-01-11 DIAGNOSIS — Z853 Personal history of malignant neoplasm of breast: Secondary | ICD-10-CM | POA: Diagnosis not present

## 2024-01-11 DIAGNOSIS — G720 Drug-induced myopathy: Secondary | ICD-10-CM | POA: Diagnosis not present

## 2024-01-11 DIAGNOSIS — K719 Toxic liver disease, unspecified: Secondary | ICD-10-CM | POA: Diagnosis not present

## 2024-01-11 DIAGNOSIS — K219 Gastro-esophageal reflux disease without esophagitis: Secondary | ICD-10-CM | POA: Diagnosis not present

## 2024-01-11 DIAGNOSIS — E782 Mixed hyperlipidemia: Secondary | ICD-10-CM | POA: Diagnosis not present

## 2024-01-11 DIAGNOSIS — R001 Bradycardia, unspecified: Secondary | ICD-10-CM | POA: Insufficient documentation

## 2024-01-11 DIAGNOSIS — R945 Abnormal results of liver function studies: Secondary | ICD-10-CM | POA: Diagnosis not present

## 2024-01-11 DIAGNOSIS — K580 Irritable bowel syndrome with diarrhea: Secondary | ICD-10-CM | POA: Diagnosis not present

## 2024-01-11 DIAGNOSIS — R002 Palpitations: Secondary | ICD-10-CM | POA: Diagnosis not present

## 2024-01-11 DIAGNOSIS — E1169 Type 2 diabetes mellitus with other specified complication: Secondary | ICD-10-CM | POA: Diagnosis not present

## 2024-01-11 DIAGNOSIS — N202 Calculus of kidney with calculus of ureter: Secondary | ICD-10-CM | POA: Diagnosis not present

## 2024-01-12 ENCOUNTER — Encounter: Payer: Self-pay | Admitting: Cardiology

## 2024-02-22 ENCOUNTER — Other Ambulatory Visit: Payer: Self-pay | Admitting: Internal Medicine

## 2024-02-22 DIAGNOSIS — Z1231 Encounter for screening mammogram for malignant neoplasm of breast: Secondary | ICD-10-CM

## 2024-02-26 ENCOUNTER — Ambulatory Visit (HOSPITAL_COMMUNITY)
Admission: RE | Admit: 2024-02-26 | Discharge: 2024-02-26 | Disposition: A | Discharge status: Home / Self Care | Source: Ambulatory Visit | Attending: Urology | Admitting: Urology

## 2024-02-26 DIAGNOSIS — N200 Calculus of kidney: Secondary | ICD-10-CM | POA: Diagnosis not present

## 2024-02-26 DIAGNOSIS — N2889 Other specified disorders of kidney and ureter: Secondary | ICD-10-CM | POA: Diagnosis not present

## 2024-02-29 ENCOUNTER — Ambulatory Visit
Admission: RE | Admit: 2024-02-29 | Discharge: 2024-02-29 | Disposition: A | Discharge status: Home / Self Care | Source: Ambulatory Visit | Attending: Internal Medicine | Admitting: Internal Medicine

## 2024-02-29 DIAGNOSIS — Z1231 Encounter for screening mammogram for malignant neoplasm of breast: Secondary | ICD-10-CM

## 2024-03-28 ENCOUNTER — Ambulatory Visit (HOSPITAL_COMMUNITY)
Admission: RE | Admit: 2024-03-28 | Discharge: 2024-03-28 | Disposition: A | Discharge status: Home / Self Care | Source: Ambulatory Visit | Attending: Urology | Admitting: Urology

## 2024-03-28 ENCOUNTER — Ambulatory Visit: Payer: PPO | Admitting: Urology

## 2024-03-28 ENCOUNTER — Encounter: Payer: Self-pay | Admitting: Urology

## 2024-03-28 ENCOUNTER — Telehealth: Payer: Self-pay

## 2024-03-28 VITALS — BP 146/75 | HR 85

## 2024-03-28 DIAGNOSIS — N3000 Acute cystitis without hematuria: Secondary | ICD-10-CM

## 2024-03-28 DIAGNOSIS — N200 Calculus of kidney: Secondary | ICD-10-CM

## 2024-03-28 DIAGNOSIS — Z87440 Personal history of urinary (tract) infections: Secondary | ICD-10-CM | POA: Diagnosis not present

## 2024-03-28 LAB — URINALYSIS, ROUTINE W REFLEX MICROSCOPIC
Bilirubin, UA: NEGATIVE
Glucose, UA: NEGATIVE
Ketones, UA: NEGATIVE
Leukocytes,UA: NEGATIVE
Nitrite, UA: NEGATIVE
Protein,UA: NEGATIVE
RBC, UA: NEGATIVE
Specific Gravity, UA: 1.025 (ref 1.005–1.030)
Urobilinogen, Ur: 0.2 mg/dL (ref 0.2–1.0)
pH, UA: 6 (ref 5.0–7.5)

## 2024-03-28 MED ORDER — NITROFURANTOIN MACROCRYSTAL 50 MG PO CAPS: 50.0000 mg | ORAL_CAPSULE | Freq: Every day | ORAL | 3 refills | Status: DC

## 2024-03-28 NOTE — H&P (View-Only) (Signed)
 03/28/2024 11:25 AM   [name] 09-16-1951 [id number]  Referring provider: Omie Bickers, MD [address] [place],  [place] [address]  Followup nephrolithiasis   HPI: Ms Reichenbach is a 73yo here for followup for nephrolithiasis and frequent UTI. She has passed multiple small calculi in the past week. Renal US  shows possible 5mm left renal calculus. She is having intermittent left flank pain for the past 2-3 months. No UTIs since last visit. She is on macrodantin  prophylaxis.    PMH: Past Medical History:  Diagnosis Date   Arthritis    Blood transfusion without reported diagnosis    Breast CA (HCC)    Cataract    Diabetes mellitus without complication (HCC)    GERD (gastroesophageal reflux disease)    Hypertension     Surgical History: Past Surgical History:  Procedure Laterality Date   BIOPSY  01/02/2020   Procedure: BIOPSY;  Surgeon: Suzette Espy, MD;  [facility];  Service: Endoscopy;;  colon   BREAST BIOPSY Left 01/19/2023   MM LT BREAST BX W LOC DEV 1ST LESION IMAGE BX SPEC STEREO GUIDE 01/19/2023 [facility]   BREAST SURGERY     CHOLECYSTECTOMY     COLONOSCOPY  2008   Dr. Riley Cheadle: Minimally friable anal canal, patient only boggy edematous terminal ileum.  Biopsies from the terminal ileum were benign.  Random colon biopsies benign.  No evidence of microscopic colitis or inflammatory bowel disease.   COLONOSCOPY WITH PROPOFOL  N/A 01/02/2020   Rourk: 2 tubular adenomas removed, random colon biopsies negative.  Next colonoscopy 5 years.   EYE SURGERY     MASTECTOMY Right    POLYPECTOMY  01/02/2020   Procedure: POLYPECTOMY;  Surgeon: Suzette Espy, MD;  [facility];  Service: Endoscopy;;   TUBAL LIGATION      Home Medications:  Allergies as of 03/28/2024       Reactions   Morphine Nausea And Vomiting   Tape Dermatitis   Paper tape "breaks me out"   Vancomycin Itching, Rash        Medication List        Accurate as of  March 28, 2024 11:25 AM. If you have any questions, ask your nurse or doctor.          acetaminophen  500 MG tablet Commonly known as: TYLENOL  Take 1,000 mg by mouth every 6 (six) hours as needed for headache.   amLODipine 5 MG tablet Commonly known as: NORVASC Take 10 mg by mouth daily.   celecoxib 100 MG capsule Commonly known as: CELEBREX Take 100 mg by mouth daily.   ezetimibe 10 MG tablet Commonly known as: ZETIA Take 10 mg by mouth daily.   fluconazole 150 MG tablet Commonly known as: DIFLUCAN Take 150 mg by mouth. As needed   metoprolol  tartrate 50 MG tablet Commonly known as: LOPRESSOR  Take 1 tablet (50 mg total) by mouth 2 (two) times daily.   nitrofurantoin  50 MG capsule Commonly known as: Macrodantin  Take 1 capsule (50 mg total) by mouth at bedtime.   nortriptyline 50 MG capsule Commonly known as: PAMELOR Take 50 mg by mouth daily.   pantoprazole 40 MG tablet Commonly known as: PROTONIX Take 40 mg by mouth daily.   Rybelsus 14 MG Tabs Generic drug: Semaglutide Take 1 tablet by mouth every morning.        Allergies:  Allergies  Allergen Reactions   Morphine Nausea And Vomiting   Tape Dermatitis  Paper tape "breaks me out"   Vancomycin Itching and Rash    Family History: Family History  Problem Relation Age of Onset   Cancer Mother        pancreatic   Heart disease Mother    Hypertension Mother    Hypertension Father    Colon cancer Neg Hx     Social History:  reports that she has quit smoking. She has never been exposed to tobacco smoke. She has never used smokeless tobacco. She reports that she does not drink alcohol and does not use drugs.  ROS: All other review of systems were reviewed and are negative except what is noted above in HPI  Physical Exam: BP (!) 146/75   Pulse 85   Constitutional:  Alert and oriented, No acute distress. HEENT: [place] AT, moist mucus membranes.  Trachea midline, no masses. Cardiovascular: No clubbing,  cyanosis, or edema. Respiratory: Normal respiratory effort, no increased work of breathing. GI: Abdomen is soft, nontender, nondistended, no abdominal masses GU: No CVA tenderness.  Lymph: No cervical or inguinal lymphadenopathy. Skin: No rashes, bruises or suspicious lesions. Neurologic: Grossly intact, no focal deficits, moving all 4 extremities. Psychiatric: Normal mood and affect.  Laboratory Data: Lab Results  Component Value Date   WBC 12.8 (H) 11/04/2022   HGB 14.5 11/04/2022   HCT 44.5 11/04/2022   MCV 88.6 11/04/2022   PLT 297 11/04/2022    Lab Results  Component Value Date   CREATININE 0.76 10/26/2023    No results found for: "PSA"  No results found for: "TESTOSTERONE"  Lab Results  Component Value Date   HGBA1C 6.2 (A) 08/22/2019    Urinalysis    Component Value Date/Time   COLORURINE YELLOW 11/04/2022 0250   APPEARANCEUR Clear 11/30/2023 0858   LABSPEC 1.025 11/04/2022 0250   PHURINE 6.0 11/04/2022 0250   GLUCOSEU Negative 11/30/2023 0858   HGBUR LARGE (A) 11/04/2022 0250   BILIRUBINUR Negative 11/30/2023 0858   KETONESUR NEGATIVE 11/04/2022 0250   PROTEINUR Trace 11/30/2023 0858   PROTEINUR NEGATIVE 11/04/2022 0250   UROBILINOGEN 0.2 02/08/2011 1638   NITRITE Negative 11/30/2023 0858   NITRITE NEGATIVE 11/04/2022 0250   LEUKOCYTESUR Trace (A) 11/30/2023 0858   LEUKOCYTESUR NEGATIVE 11/04/2022 0250    Lab Results  Component Value Date   LABMICR See below: 11/30/2023   WBCUA 6-10 (A) 11/30/2023   LABEPIT 0-10 11/30/2023   MUCUS Present (A) 11/30/2023   BACTERIA None seen 11/30/2023    Pertinent Imaging: KUB today: Images reviewed and discussed with the patient Results for orders placed in visit on 12/12/22  DG Abd 1 View  Narrative CLINICAL DATA:  Kidney stone  EXAM: ABDOMEN - 1 VIEW  COMPARISON:  CT abdomen pelvis 11/04/2022  FINDINGS: No definite urinary tract calcification identified.  Specifically, tiny proximal LEFT  ureteral calculus seen on prior CT not identified.  Tiny pelvic phleboliths.  Bone island LEFT sacrum unchanged.  Bones demineralized with degenerative disc disease changes lumbar spine.  Surgical clips RIGHT upper quadrant question cholecystectomy.  Bowel gas pattern normal.  IMPRESSION: No definite urinary tract calcifications identified.   Electronically Signed By: Wynelle Heather M.D. On: 12/12/2022 17:09  No results found for this or any previous visit.  No results found for this or any previous visit.  No results found for this or any previous visit.  Results for orders placed during the hospital encounter of 02/26/24  US  RENAL  Narrative CLINICAL DATA:  Nephrolithiasis.  EXAM: RENAL / URINARY TRACT  ULTRASOUND COMPLETE  COMPARISON:  June 01, 2023  FINDINGS: Right Kidney:  Renal measurements: 10.2 x 4.7 x 4.7 cm = volume: 118.4 mL. Echogenicity within normal limits. No mass or hydronephrosis visualized.  Left Kidney:  Renal measurements: 9.8 x 5 x 5.2 cm = volume: 132.8 mL. 5.6 mm nonobstructing calcification identified in the lower pole left kidney. Echogenicity within normal limits. No mass or hydronephrosis visualized.  Bladder:  Appears normal for degree of bladder distention. Bilateral ureteral jets are noted.  Other:  None.  IMPRESSION: 1. No acute abnormality identified. 2. 5.6 mm nonobstructing calcification identified in the lower pole left kidney.   Electronically Signed By: Anna Barnes M.D. On: 02/26/2024 15:23  No results found for this or any previous visit.  No results found for this or any previous visit.  Results for orders placed in visit on 06/18/23  CT RENAL STONE STUDY  Narrative CLINICAL DATA:  Symptomatic urolithiasis  EXAM: CT ABDOMEN AND PELVIS WITHOUT CONTRAST  TECHNIQUE: Multidetector CT imaging of the abdomen and pelvis was performed following the standard protocol without IV  contrast.  RADIATION DOSE REDUCTION: This exam was performed according to the departmental dose-optimization program which includes automated exposure control, adjustment of the mA and/or kV according to patient size and/or use of iterative reconstruction technique.  COMPARISON:  11/04/2022  FINDINGS: Lower chest: Right mastectomy.  Hepatobiliary: Cholecystectomy.  Otherwise unremarkable.  Pancreas: Stable fatty atrophy of the pancreas.  Spleen: Unremarkable  Adrenals/Urinary Tract: Both adrenal glands appear normal.  2 mm right kidney lower pole nonobstructive renal calculus. No other urinary tract calculi are identified.  Stomach/Bowel: Scattered diverticula of the descending and sigmoid colon.  Vascular/Lymphatic: Atherosclerosis is present, including aortoiliac atherosclerotic disease.  Reproductive: Unremarkable  Other: No supplemental non-categorized findings.  Musculoskeletal: Lumbar spondylosis and degenerative disc disease causing multilevel impingement.  IMPRESSION: 1. 2 mm right kidney lower pole nonobstructive renal calculus. No other urinary tract calculi are identified. 2. Lumbar spondylosis and degenerative disc disease causing multilevel impingement. 3. Aortoiliac atherosclerosis.  Aortic Atherosclerosis (ICD10-I70.0).   Electronically Signed By: Freida Jes M.D. On: 07/17/2023 10:36   Assessment & Plan:    1. Nephrolithiasis (Primary) -We discussed the management of kidney stones. These options include observation, ureteroscopy, shockwave lithotripsy (ESWL) and percutaneous nephrolithotomy (PCNL). We discussed which options are relevant to the patient's stone(s). We discussed the natural history of kidney stones as well as the complications of untreated stones and the impact on quality of life without treatment as well as with each of the above listed treatments. We also discussed the efficacy of each treatment in its ability to clear the  stone burden. With any of these management options I discussed the signs and symptoms of infection and the need for emergent treatment should these be experienced. For each option we discussed the ability of each procedure to clear the patient of their stone burden.   For observation I described the risks which include but are not limited to silent renal damage, life-threatening infection, need for emergent surgery, failure to pass stone and pain.   For ureteroscopy I described the risks which include bleeding, infection, damage to contiguous structures, positioning injury, ureteral stricture, ureteral avulsion, ureteral injury, need for prolonged ureteral stent, inability to perform ureteroscopy, need for an interval procedure, inability to clear stone burden, stent discomfort/pain, heart attack, stroke, pulmonary embolus and the inherent risks with general anesthesia.   For shockwave lithotripsy I described the risks which include arrhythmia, kidney contusion, kidney hemorrhage,  need for transfusion, pain, inability to adequately break up stone, inability to pass stone fragments, Steinstrasse, infection associated with obstructing stones, need for alternate surgical procedure, need for repeat shockwave lithotripsy, MI, CVA, PE and the inherent risks with anesthesia/conscious sedation.   For PCNL I described the risks including positioning injury, pneumothorax, hydrothorax, need for chest tube, inability to clear stone burden, renal laceration, arterial venous fistula or malformation, need for embolization of kidney, loss of kidney or renal function, need for repeat procedure, need for prolonged nephrostomy tube, ureteral avulsion, MI, CVA, PE and the inherent risks of general anesthesia.   - The patient would like to proceed with left ESWL - Urinalysis, Routine w reflex microscopic  2. Acute cystitis without hematuria Continue macrodantin  50mg  qhs   No follow-ups on file.  Johnie Nailer,  MD  [facility] [place]

## 2024-03-28 NOTE — Progress Notes (Signed)
 03/28/2024 11:25 AM   [name] 09-16-1951 [id number]  Referring provider: Omie Bickers, MD [address] [place],  [place] [address]  Followup nephrolithiasis   HPI: Ms Reichenbach is a 73yo here for followup for nephrolithiasis and frequent UTI. She has passed multiple small calculi in the past week. Renal US  shows possible 5mm left renal calculus. She is having intermittent left flank pain for the past 2-3 months. No UTIs since last visit. She is on macrodantin  prophylaxis.    PMH: Past Medical History:  Diagnosis Date   Arthritis    Blood transfusion without reported diagnosis    Breast CA (HCC)    Cataract    Diabetes mellitus without complication (HCC)    GERD (gastroesophageal reflux disease)    Hypertension     Surgical History: Past Surgical History:  Procedure Laterality Date   BIOPSY  01/02/2020   Procedure: BIOPSY;  Surgeon: Suzette Espy, MD;  [facility]: [facility];  Service: Endoscopy;;  colon   BREAST BIOPSY Left 01/19/2023   MM LT BREAST BX W LOC DEV 1ST LESION IMAGE BX SPEC STEREO GUIDE 01/19/2023 [facility]   BREAST SURGERY     CHOLECYSTECTOMY     COLONOSCOPY  2008   Dr. Riley Cheadle: Minimally friable anal canal, patient only boggy edematous terminal ileum.  Biopsies from the terminal ileum were benign.  Random colon biopsies benign.  No evidence of microscopic colitis or inflammatory bowel disease.   COLONOSCOPY WITH PROPOFOL  N/A 01/02/2020   Rourk: 2 tubular adenomas removed, random colon biopsies negative.  Next colonoscopy 5 years.   EYE SURGERY     MASTECTOMY Right    POLYPECTOMY  01/02/2020   Procedure: POLYPECTOMY;  Surgeon: Suzette Espy, MD;  Location: [facility];  Service: Endoscopy;;   TUBAL LIGATION      Home Medications:  Allergies as of 03/28/2024       Reactions   Morphine Nausea And Vomiting   Tape Dermatitis   Paper tape "breaks me out"   Vancomycin Itching, Rash        Medication List        Accurate as of  March 28, 2024 11:25 AM. If you have any questions, ask your nurse or doctor.          acetaminophen  500 MG tablet Commonly known as: TYLENOL  Take 1,000 mg by mouth every 6 (six) hours as needed for headache.   amLODipine 5 MG tablet Commonly known as: NORVASC Take 10 mg by mouth daily.   celecoxib 100 MG capsule Commonly known as: CELEBREX Take 100 mg by mouth daily.   ezetimibe 10 MG tablet Commonly known as: ZETIA Take 10 mg by mouth daily.   fluconazole 150 MG tablet Commonly known as: DIFLUCAN Take 150 mg by mouth. As needed   metoprolol  tartrate 50 MG tablet Commonly known as: LOPRESSOR  Take 1 tablet (50 mg total) by mouth 2 (two) times daily.   nitrofurantoin  50 MG capsule Commonly known as: Macrodantin  Take 1 capsule (50 mg total) by mouth at bedtime.   nortriptyline 50 MG capsule Commonly known as: PAMELOR Take 50 mg by mouth daily.   pantoprazole 40 MG tablet Commonly known as: PROTONIX Take 40 mg by mouth daily.   Rybelsus 14 MG Tabs Generic drug: Semaglutide Take 1 tablet by mouth every morning.        Allergies:  Allergies  Allergen Reactions   Morphine Nausea And Vomiting   Tape Dermatitis  Paper tape "breaks me out"   Vancomycin Itching and Rash    Family History: Family History  Problem Relation Age of Onset   Cancer Mother        pancreatic   Heart disease Mother    Hypertension Mother    Hypertension Father    Colon cancer Neg Hx     Social History:  reports that she has quit smoking. She has never been exposed to tobacco smoke. She has never used smokeless tobacco. She reports that she does not drink alcohol and does not use drugs.  ROS: All other review of systems were reviewed and are negative except what is noted above in HPI  Physical Exam: BP (!) 146/75   Pulse 85   Constitutional:  Alert and oriented, No acute distress. HEENT: [place] AT, moist mucus membranes.  Trachea midline, no masses. Cardiovascular: No clubbing,  cyanosis, or edema. Respiratory: Normal respiratory effort, no increased work of breathing. GI: Abdomen is soft, nontender, nondistended, no abdominal masses GU: No CVA tenderness.  Lymph: No cervical or inguinal lymphadenopathy. Skin: No rashes, bruises or suspicious lesions. Neurologic: Grossly intact, no focal deficits, moving all 4 extremities. Psychiatric: Normal mood and affect.  Laboratory Data: Lab Results  Component Value Date   WBC 12.8 (H) 11/04/2022   HGB 14.5 11/04/2022   HCT 44.5 11/04/2022   MCV 88.6 11/04/2022   PLT 297 11/04/2022    Lab Results  Component Value Date   CREATININE 0.76 10/26/2023    No results found for: "PSA"  No results found for: "TESTOSTERONE"  Lab Results  Component Value Date   HGBA1C 6.2 (A) 08/22/2019    Urinalysis    Component Value Date/Time   COLORURINE YELLOW 11/04/2022 0250   APPEARANCEUR Clear 11/30/2023 0858   LABSPEC 1.025 11/04/2022 0250   PHURINE 6.0 11/04/2022 0250   GLUCOSEU Negative 11/30/2023 0858   HGBUR LARGE (A) 11/04/2022 0250   BILIRUBINUR Negative 11/30/2023 0858   KETONESUR NEGATIVE 11/04/2022 0250   PROTEINUR Trace 11/30/2023 0858   PROTEINUR NEGATIVE 11/04/2022 0250   UROBILINOGEN 0.2 02/08/2011 1638   NITRITE Negative 11/30/2023 0858   NITRITE NEGATIVE 11/04/2022 0250   LEUKOCYTESUR Trace (A) 11/30/2023 0858   LEUKOCYTESUR NEGATIVE 11/04/2022 0250    Lab Results  Component Value Date   LABMICR See below: 11/30/2023   WBCUA 6-10 (A) 11/30/2023   LABEPIT 0-10 11/30/2023   MUCUS Present (A) 11/30/2023   BACTERIA None seen 11/30/2023    Pertinent Imaging: KUB today: Images reviewed and discussed with the patient Results for orders placed in visit on 12/12/22  DG Abd 1 View  Narrative CLINICAL DATA:  Kidney stone  EXAM: ABDOMEN - 1 VIEW  COMPARISON:  CT abdomen pelvis 11/04/2022  FINDINGS: No definite urinary tract calcification identified.  Specifically, tiny proximal LEFT  ureteral calculus seen on prior CT not identified.  Tiny pelvic phleboliths.  Bone island LEFT sacrum unchanged.  Bones demineralized with degenerative disc disease changes lumbar spine.  Surgical clips RIGHT upper quadrant question cholecystectomy.  Bowel gas pattern normal.  IMPRESSION: No definite urinary tract calcifications identified.   Electronically Signed By: Wynelle Heather M.D. On: 12/12/2022 17:09  No results found for this or any previous visit.  No results found for this or any previous visit.  No results found for this or any previous visit.  Results for orders placed during the hospital encounter of 02/26/24  US  RENAL  Narrative CLINICAL DATA:  Nephrolithiasis.  EXAM: RENAL / URINARY TRACT  ULTRASOUND COMPLETE  COMPARISON:  June 01, 2023  FINDINGS: Right Kidney:  Renal measurements: 10.2 x 4.7 x 4.7 cm = volume: 118.4 mL. Echogenicity within normal limits. No mass or hydronephrosis visualized.  Left Kidney:  Renal measurements: 9.8 x 5 x 5.2 cm = volume: 132.8 mL. 5.6 mm nonobstructing calcification identified in the lower pole left kidney. Echogenicity within normal limits. No mass or hydronephrosis visualized.  Bladder:  Appears normal for degree of bladder distention. Bilateral ureteral jets are noted.  Other:  None.  IMPRESSION: 1. No acute abnormality identified. 2. 5.6 mm nonobstructing calcification identified in the lower pole left kidney.   Electronically Signed By: Anna Barnes M.D. On: 02/26/2024 15:23  No results found for this or any previous visit.  No results found for this or any previous visit.  Results for orders placed in visit on 06/18/23  CT RENAL STONE STUDY  Narrative CLINICAL DATA:  Symptomatic urolithiasis  EXAM: CT ABDOMEN AND PELVIS WITHOUT CONTRAST  TECHNIQUE: Multidetector CT imaging of the abdomen and pelvis was performed following the standard protocol without IV  contrast.  RADIATION DOSE REDUCTION: This exam was performed according to the departmental dose-optimization program which includes automated exposure control, adjustment of the mA and/or kV according to patient size and/or use of iterative reconstruction technique.  COMPARISON:  11/04/2022  FINDINGS: Lower chest: Right mastectomy.  Hepatobiliary: Cholecystectomy.  Otherwise unremarkable.  Pancreas: Stable fatty atrophy of the pancreas.  Spleen: Unremarkable  Adrenals/Urinary Tract: Both adrenal glands appear normal.  2 mm right kidney lower pole nonobstructive renal calculus. No other urinary tract calculi are identified.  Stomach/Bowel: Scattered diverticula of the descending and sigmoid colon.  Vascular/Lymphatic: Atherosclerosis is present, including aortoiliac atherosclerotic disease.  Reproductive: Unremarkable  Other: No supplemental non-categorized findings.  Musculoskeletal: Lumbar spondylosis and degenerative disc disease causing multilevel impingement.  IMPRESSION: 1. 2 mm right kidney lower pole nonobstructive renal calculus. No other urinary tract calculi are identified. 2. Lumbar spondylosis and degenerative disc disease causing multilevel impingement. 3. Aortoiliac atherosclerosis.  Aortic Atherosclerosis (ICD10-I70.0).   Electronically Signed By: Freida Jes M.D. On: 07/17/2023 10:36   Assessment & Plan:    1. Nephrolithiasis (Primary) -We discussed the management of kidney stones. These options include observation, ureteroscopy, shockwave lithotripsy (ESWL) and percutaneous nephrolithotomy (PCNL). We discussed which options are relevant to the patient's stone(s). We discussed the natural history of kidney stones as well as the complications of untreated stones and the impact on quality of life without treatment as well as with each of the above listed treatments. We also discussed the efficacy of each treatment in its ability to clear the  stone burden. With any of these management options I discussed the signs and symptoms of infection and the need for emergent treatment should these be experienced. For each option we discussed the ability of each procedure to clear the patient of their stone burden.   For observation I described the risks which include but are not limited to silent renal damage, life-threatening infection, need for emergent surgery, failure to pass stone and pain.   For ureteroscopy I described the risks which include bleeding, infection, damage to contiguous structures, positioning injury, ureteral stricture, ureteral avulsion, ureteral injury, need for prolonged ureteral stent, inability to perform ureteroscopy, need for an interval procedure, inability to clear stone burden, stent discomfort/pain, heart attack, stroke, pulmonary embolus and the inherent risks with general anesthesia.   For shockwave lithotripsy I described the risks which include arrhythmia, kidney contusion, kidney hemorrhage,  need for transfusion, pain, inability to adequately break up stone, inability to pass stone fragments, Steinstrasse, infection associated with obstructing stones, need for alternate surgical procedure, need for repeat shockwave lithotripsy, MI, CVA, PE and the inherent risks with anesthesia/conscious sedation.   For PCNL I described the risks including positioning injury, pneumothorax, hydrothorax, need for chest tube, inability to clear stone burden, renal laceration, arterial venous fistula or malformation, need for embolization of kidney, loss of kidney or renal function, need for repeat procedure, need for prolonged nephrostomy tube, ureteral avulsion, MI, CVA, PE and the inherent risks of general anesthesia.   - The patient would like to proceed with left ESWL - Urinalysis, Routine w reflex microscopic  2. Acute cystitis without hematuria Continue macrodantin  50mg  qhs   No follow-ups on file.  Johnie Nailer,  MD  [facility]

## 2024-03-28 NOTE — Telephone Encounter (Signed)
 Patient was made aware to hold RYBELSUS the day before and the day of her LITHO procedure. Patient voiced understanding.

## 2024-03-28 NOTE — Patient Instructions (Signed)
 ESWL for Kidney Stones  Extracorporeal shock wave lithotripsy (ESWL) is a treatment that can help break up kidney stones that are too large to pass on their own.  This is a nonsurgical procedure that breaks up a kidney stone with shock waves. These shock waves pass through your body and focus on the kidney stone. They cause the kidney stone to break into smaller pieces (fragments) while it is still in the urinary tract. The fragments of stone can pass more easily out of your body in the pee (urine). Tell a health care provider about: Any allergies you have. All medicines you are taking, including vitamins, herbs, eye drops, creams, and over-the-counter medicines. Any problems you or family members have had with anesthetic medicines. Any bleeding problems you have. Any surgeries you've had. Any medical conditions you have. Whether you're pregnant or may be pregnant. What are the risks? Your health care provider will talk with you about risks. These may include: Infection. Bleeding from the kidney. Bruising of the kidney or skin. Scarring of the kidney. This can lead to: Increased blood pressure. Poor kidney function. Return (recurrence) of kidney stones. Damage to other structures or organs. This may include the liver, colon, spleen, or pancreas. Blockage (obstruction) of the tube that carries pee from the kidney to the bladder (ureter). Failure of the kidney stone to break into fragments. What happens before the procedure? When to stop eating and drinking Follow instructions from your health care provider about what you may eat and drink. These may include: 8 hours before your procedure Stop eating most foods. Do not eat meat, fried foods, or fatty foods. Eat only light foods, such as toast or crackers. All liquids are okay except energy drinks and alcohol. 6 hours before your procedure Stop eating. Drink only clear liquids, such as water, clear fruit juice, black coffee, plain tea,  and sports drinks. Do not drink energy drinks or alcohol. 2 hours before your procedure Stop drinking all liquids. You may be allowed to take medicines with small sips of water. If you do not follow your health care provider's instructions, your procedure may be delayed or canceled. Medicines Ask your health care provider about: Changing or stopping your regular medicines. These include any diabetes medicines or blood thinners you take. Taking medicines such as aspirin and ibuprofen. These medicines can thin your blood. Do not take them unless your health care provider tells you to. Taking over-the-counter medicines, vitamins, herbs, and supplements. Tests You may have tests, such as: Blood tests. Pee (urine) tests. Imaging tests. This may include a CT scan. Surgery safety Ask your health care provider: How your surgery site will be marked. What steps will be taken to help prevent infection. These steps may include: Washing skin with a soap that kills germs. Receiving antibiotics. General instructions If you will be going home right after the procedure, plan to have a responsible adult: Take you home from the hospital or clinic. You will not be allowed to drive. Care for you for the time you are told. What happens during the procedure?  An IV will be inserted into one of your veins. You may be given: A sedative. This helps you relax. Anesthesia. This will: Numb certain areas of your body. Make you fall asleep for surgery. A water-filled cushion may be placed behind your kidney or on your abdomen. In some cases, you may be placed in a tub of lukewarm water. Your body will be positioned in a way that makes it  easier to target the kidney stone. An X-ray or ultrasound exam will be done to locate your stone. Shock waves will be aimed at the stone. If you are awake, you may feel a tapping sensation as the shock waves pass through your body. A small mesh tube (stent) may be placed in  your ureter. This will help keep pee flowing from the kidney if the fragments of the stone have been blocking the ureter. The stent will be removed at a later time by your health care provider. The procedure may vary among health care providers and hospitals. What happens after the procedure? Your blood pressure, heart rate, breathing rate, and blood oxygen level will be monitored until you leave the hospital or clinic. You may have an X-ray after the procedure to see how many of the kidney stones were broken up. This will also show how much of the stone has passed. If there are still large fragments after treatment, you may need to have a second procedure at a later time. This information is not intended to replace advice given to you by your health care provider. Make sure you discuss any questions you have with your health care provider. Document Revised: 04/18/2023 Document Reviewed: 02/06/2022 Elsevier Patient Education  2024 [employer].

## 2024-04-01 ENCOUNTER — Encounter (HOSPITAL_COMMUNITY)
Admission: RE | Admit: 2024-04-01 | Discharge: 2024-04-01 | Disposition: A | Discharge status: Home / Self Care | Source: Ambulatory Visit | Attending: Urology | Admitting: Urology

## 2024-04-01 ENCOUNTER — Other Ambulatory Visit: Payer: Self-pay

## 2024-04-01 ENCOUNTER — Encounter (HOSPITAL_COMMUNITY): Payer: Self-pay

## 2024-04-01 HISTORY — DX: Personal history of urinary calculi: Z87.442

## 2024-04-05 ENCOUNTER — Ambulatory Visit (HOSPITAL_COMMUNITY)

## 2024-04-05 ENCOUNTER — Encounter (HOSPITAL_COMMUNITY)
Admission: RE | Disposition: A | Discharge status: Home / Self Care | Payer: Self-pay | Source: Home / Self Care | Attending: Urology

## 2024-04-05 ENCOUNTER — Encounter (HOSPITAL_COMMUNITY): Payer: Self-pay | Admitting: Urology

## 2024-04-05 ENCOUNTER — Other Ambulatory Visit: Payer: Self-pay

## 2024-04-05 ENCOUNTER — Ambulatory Visit (HOSPITAL_COMMUNITY)
Admission: RE | Admit: 2024-04-05 | Discharge: 2024-04-05 | Disposition: A | Discharge status: Home / Self Care | Attending: Urology | Admitting: Urology

## 2024-04-05 DIAGNOSIS — N3000 Acute cystitis without hematuria: Secondary | ICD-10-CM | POA: Insufficient documentation

## 2024-04-05 DIAGNOSIS — E119 Type 2 diabetes mellitus without complications: Secondary | ICD-10-CM | POA: Insufficient documentation

## 2024-04-05 DIAGNOSIS — Z87891 Personal history of nicotine dependence: Secondary | ICD-10-CM | POA: Insufficient documentation

## 2024-04-05 DIAGNOSIS — Z87442 Personal history of urinary calculi: Secondary | ICD-10-CM | POA: Diagnosis not present

## 2024-04-05 DIAGNOSIS — K219 Gastro-esophageal reflux disease without esophagitis: Secondary | ICD-10-CM | POA: Diagnosis not present

## 2024-04-05 DIAGNOSIS — N200 Calculus of kidney: Secondary | ICD-10-CM | POA: Diagnosis not present

## 2024-04-05 DIAGNOSIS — I10 Essential (primary) hypertension: Secondary | ICD-10-CM | POA: Diagnosis not present

## 2024-04-05 HISTORY — PX: EXTRACORPOREAL SHOCK WAVE LITHOTRIPSY: SHX1557

## 2024-04-05 LAB — GLUCOSE, CAPILLARY: Glucose-Capillary: 91 mg/dL (ref 70–99)

## 2024-04-05 SURGERY — LITHOTRIPSY, ESWL
Anesthesia: LOCAL | Laterality: Left

## 2024-04-05 MED ORDER — DIPHENHYDRAMINE HCL 25 MG PO CAPS
25.0000 mg | ORAL_CAPSULE | ORAL | Status: AC
Start: 2024-04-05 — End: 2024-04-05
  Administered 2024-04-05: 25 mg via ORAL
  Filled 2024-04-05: qty 1

## 2024-04-05 MED ORDER — TAMSULOSIN HCL 0.4 MG PO CAPS: 0.4000 mg | ORAL_CAPSULE | Freq: Every day | ORAL | 0 refills | Status: DC

## 2024-04-05 MED ORDER — DIAZEPAM 5 MG PO TABS
10.0000 mg | ORAL_TABLET | Freq: Once | ORAL | Status: AC
  Administered 2024-04-05: 10 mg via ORAL
  Filled 2024-04-05: qty 2

## 2024-04-05 MED ORDER — SODIUM CHLORIDE 0.9 % IV SOLN: Freq: Once | INTRAVENOUS | Status: AC

## 2024-04-05 MED ORDER — HYDROCODONE-ACETAMINOPHEN 5-325 MG PO TABS: 1.0000 | ORAL_TABLET | Freq: Four times a day (QID) | ORAL | 0 refills | Status: AC | PRN

## 2024-04-05 MED ORDER — ONDANSETRON HCL 4 MG PO TABS: 4.0000 mg | ORAL_TABLET | Freq: Every day | ORAL | 1 refills | Status: AC | PRN

## 2024-04-05 NOTE — Interval H&P Note (Signed)
 History and Physical Interval Note:  04/05/2024 8:02 AM  [name]  has presented today for surgery, with the diagnosis of left renal calculus.  The various methods of treatment have been discussed with the patient and family. After consideration of risks, benefits and other options for treatment, the patient has consented to  Procedure(s): LITHOTRIPSY, ESWL (Left) as a surgical intervention.  The patient's history has been reviewed, patient examined, no change in status, stable for surgery.  I have reviewed the patient's chart and labs.  Questions were answered to the patient's satisfaction.     Lauren Sullivan

## 2024-04-06 ENCOUNTER — Encounter (HOSPITAL_COMMUNITY): Payer: Self-pay | Admitting: Urology

## 2024-04-13 ENCOUNTER — Encounter (HOSPITAL_COMMUNITY): Payer: Self-pay | Admitting: Urology

## 2024-04-15 ENCOUNTER — Ambulatory Visit (HOSPITAL_COMMUNITY)
Admission: RE | Admit: 2024-04-15 | Discharge: 2024-04-15 | Disposition: A | Discharge status: Home / Self Care | Source: Ambulatory Visit | Attending: Urology | Admitting: Urology

## 2024-04-15 DIAGNOSIS — N200 Calculus of kidney: Secondary | ICD-10-CM | POA: Insufficient documentation

## 2024-04-15 DIAGNOSIS — I878 Other specified disorders of veins: Secondary | ICD-10-CM | POA: Diagnosis not present

## 2024-04-20 ENCOUNTER — Encounter: Payer: Self-pay | Admitting: Urology

## 2024-04-20 ENCOUNTER — Ambulatory Visit: Admitting: Urology

## 2024-04-20 VITALS — BP 148/77 | HR 80

## 2024-04-20 DIAGNOSIS — N200 Calculus of kidney: Secondary | ICD-10-CM

## 2024-04-20 LAB — URINALYSIS, ROUTINE W REFLEX MICROSCOPIC
Bilirubin, UA: NEGATIVE
Glucose, UA: NEGATIVE
Ketones, UA: NEGATIVE
Leukocytes,UA: NEGATIVE
Nitrite, UA: NEGATIVE
Protein,UA: NEGATIVE
RBC, UA: NEGATIVE
Specific Gravity, UA: 1.03 (ref 1.005–1.030)
Urobilinogen, Ur: 0.2 mg/dL (ref 0.2–1.0)
pH, UA: 6 (ref 5.0–7.5)

## 2024-04-20 NOTE — Patient Instructions (Signed)

## 2024-04-20 NOTE — Progress Notes (Signed)
 04/20/2024 9:27 AM   Lauren Sullivan December 23, 1950 [id number]  Referring provider: Shona Norleen PEDLAR, MD [address] [name],  [place] [address]  Followup nephrolithiasis   HPI: Ms Lauren Sullivan is a 73yo here for followup for nephrolithiasis. She passed numerous fragments. Her left flank pain resolved. KUB shows no residual calculi.    PMH: Past Medical History:  Diagnosis Date   Arthritis    Blood transfusion without reported diagnosis    Breast CA (HCC)    Cataract    Diabetes mellitus without complication (HCC)    GERD (gastroesophageal reflux disease)    History of kidney stones    Hypertension     Surgical History: Past Surgical History:  Procedure Laterality Date   BIOPSY  01/02/2020   Procedure: BIOPSY;  Surgeon: Shaaron Lamar HERO, MD;  [facility];  Service: Endoscopy;;  colon   BREAST BIOPSY Left 01/19/2023   MM LT BREAST BX W LOC DEV 1ST LESION IMAGE BX SPEC STEREO GUIDE 01/19/2023 [facility]   BREAST SURGERY     CHOLECYSTECTOMY     COLONOSCOPY  2008   Dr. Shaaron: Minimally friable anal canal, patient only boggy edematous terminal ileum.  Biopsies from the terminal ileum were benign.  Random colon biopsies benign.  No evidence of microscopic colitis or inflammatory bowel disease.   COLONOSCOPY WITH PROPOFOL  N/A 01/02/2020   Rourk: 2 tubular adenomas removed, random colon biopsies negative.  Next colonoscopy 5 years.   EXTRACORPOREAL SHOCK WAVE LITHOTRIPSY Left 04/05/2024   Procedure: LITHOTRIPSY, ESWL;  Surgeon: Sherrilee Belvie CROME, MD;  Location: [facility];  Service: Urology;  Laterality: Left;   EYE SURGERY Bilateral    MASTECTOMY Right    POLYPECTOMY  01/02/2020   Procedure: POLYPECTOMY;  Surgeon: Shaaron Lamar HERO, MD;  [facility];  Service: Endoscopy;;   TUBAL LIGATION      Home Medications:  Allergies as of 04/20/2024       Reactions   Morphine Nausea And Vomiting   Tape Dermatitis   Paper tape breaks me out   Vancomycin Itching,  Rash        Medication List        Accurate as of April 20, 2024  9:27 AM. If you have any questions, ask your nurse or doctor.          acetaminophen  500 MG tablet Commonly known as: TYLENOL  Take 1,000 mg by mouth every 6 (six) hours as needed for headache.   amLODipine 5 MG tablet Commonly known as: NORVASC Take 10 mg by mouth daily.   celecoxib 100 MG capsule Commonly known as: CELEBREX Take 100 mg by mouth daily.   ezetimibe 10 MG tablet Commonly known as: ZETIA Take 10 mg by mouth daily.   fluconazole 150 MG tablet Commonly known as: DIFLUCAN Take 150 mg by mouth. As needed   losartan 25 MG tablet Commonly known as: COZAAR Take 25 mg by mouth daily.   metoprolol  tartrate 50 MG tablet Commonly known as: LOPRESSOR  Take 1 tablet (50 mg total) by mouth 2 (two) times daily.   nitrofurantoin  50 MG capsule Commonly known as: Macrodantin  Take 1 capsule (50 mg total) by mouth at bedtime.   nortriptyline 50 MG capsule Commonly known as: PAMELOR Take 50 mg by mouth daily.   ondansetron  4 MG tablet Commonly known as: Zofran  Take 1 tablet (4 mg total) by mouth daily as needed for nausea or vomiting.   pantoprazole 40 MG tablet Commonly known  as: PROTONIX Take 40 mg by mouth daily.   Rybelsus 14 MG Tabs Generic drug: Semaglutide Take 1 tablet by mouth every morning.   tamsulosin  0.4 MG Caps capsule Commonly known as: Flomax  Take 1 capsule (0.4 mg total) by mouth daily after supper.        Allergies:  Allergies  Allergen Reactions   Morphine Nausea And Vomiting   Tape Dermatitis    Paper tape breaks me out   Vancomycin Itching and Rash    Family History: Family History  Problem Relation Age of Onset   Cancer Mother        pancreatic   Heart disease Mother    Hypertension Mother    Hypertension Father    Colon cancer Neg Hx     Social History:  reports that she has quit smoking. She has never been exposed to tobacco smoke. She has never  used smokeless tobacco. She reports that she does not drink alcohol and does not use drugs.  ROS: All other review of systems were reviewed and are negative except what is noted above in HPI  Physical Exam: BP (!) 148/77   Pulse 80   Constitutional:  Alert and oriented, No acute distress. HEENT: [place] AT, moist mucus membranes.  Trachea midline, no masses. Cardiovascular: No clubbing, cyanosis, or edema. Respiratory: Normal respiratory effort, no increased work of breathing. GI: Abdomen is soft, nontender, nondistended, no abdominal masses GU: No CVA tenderness.  Lymph: No cervical or inguinal lymphadenopathy. Skin: No rashes, bruises or suspicious lesions. Neurologic: Grossly intact, no focal deficits, moving all 4 extremities. Psychiatric: Normal mood and affect.  Laboratory Data: Lab Results  Component Value Date   WBC 12.8 (H) 11/04/2022   HGB 14.5 11/04/2022   HCT 44.5 11/04/2022   MCV 88.6 11/04/2022   PLT 297 11/04/2022    Lab Results  Component Value Date   CREATININE 0.76 10/26/2023    No results found for: PSA  No results found for: TESTOSTERONE  Lab Results  Component Value Date   HGBA1C 6.2 (A) 08/22/2019    Urinalysis    Component Value Date/Time   COLORURINE YELLOW 11/04/2022 0250   APPEARANCEUR Clear 03/28/2024 1100   LABSPEC 1.025 11/04/2022 0250   PHURINE 6.0 11/04/2022 0250   GLUCOSEU Negative 03/28/2024 1100   HGBUR LARGE (A) 11/04/2022 0250   BILIRUBINUR Negative 03/28/2024 1100   KETONESUR NEGATIVE 11/04/2022 0250   PROTEINUR Negative 03/28/2024 1100   PROTEINUR NEGATIVE 11/04/2022 0250   UROBILINOGEN 0.2 02/08/2011 1638   NITRITE Negative 03/28/2024 1100   NITRITE NEGATIVE 11/04/2022 0250   LEUKOCYTESUR Negative 03/28/2024 1100   LEUKOCYTESUR NEGATIVE 11/04/2022 0250    Lab Results  Component Value Date   LABMICR Comment 03/28/2024   WBCUA 6-10 (A) 11/30/2023   LABEPIT 0-10 11/30/2023   MUCUS Present (A) 11/30/2023   BACTERIA  None seen 11/30/2023    Pertinent Imaging: KUB 04/15/2024: Images reviewed and discussed with the patient Results for orders placed during the hospital encounter of 04/05/24  DG Abd 1 View  Narrative CLINICAL DATA:  Pre lithotripsy.  Left-sided kidney stone.  EXAM: ABDOMEN - 1 VIEW  COMPARISON:  Radiographs 03/28/2024.  CT 07/03/2023.  FINDINGS: No definite calcifications are seen over the kidneys or expected course of the ureters. Small pelvic calcifications bilaterally are grossly stable, likely phleboliths. There is a normal nonobstructive bowel gas pattern. Cholecystectomy clips are noted. There are degenerative changes in the spine.  IMPRESSION: No definite urinary tract calculi identified radiographically.  Small pelvic calcifications are grossly stable, likely phleboliths.   Electronically Signed By: Elsie Perone M.D. On: 04/05/2024 11:29  No results found for this or any previous visit.  No results found for this or any previous visit.  No results found for this or any previous visit.  Results for orders placed during the hospital encounter of 02/26/24  US  RENAL  Narrative CLINICAL DATA:  Nephrolithiasis.  EXAM: RENAL / URINARY TRACT ULTRASOUND COMPLETE  COMPARISON:  June 01, 2023  FINDINGS: Right Kidney:  Renal measurements: 10.2 x 4.7 x 4.7 cm = volume: 118.4 mL. Echogenicity within normal limits. No mass or hydronephrosis visualized.  Left Kidney:  Renal measurements: 9.8 x 5 x 5.2 cm = volume: 132.8 mL. 5.6 mm nonobstructing calcification identified in the lower pole left kidney. Echogenicity within normal limits. No mass or hydronephrosis visualized.  Bladder:  Appears normal for degree of bladder distention. Bilateral ureteral jets are noted.  Other:  None.  IMPRESSION: 1. No acute abnormality identified. 2. 5.6 mm nonobstructing calcification identified in the lower pole left kidney.   Electronically Signed By:  Craig Farr M.D. On: 02/26/2024 15:23  No results found for this or any previous visit.  No results found for this or any previous visit.  Results for orders placed in visit on 06/18/23  CT RENAL STONE STUDY  Narrative CLINICAL DATA:  Symptomatic urolithiasis  EXAM: CT ABDOMEN AND PELVIS WITHOUT CONTRAST  TECHNIQUE: Multidetector CT imaging of the abdomen and pelvis was performed following the standard protocol without IV contrast.  RADIATION DOSE REDUCTION: This exam was performed according to the departmental dose-optimization program which includes automated exposure control, adjustment of the mA and/or kV according to patient size and/or use of iterative reconstruction technique.  COMPARISON:  11/04/2022  FINDINGS: Lower chest: Right mastectomy.  Hepatobiliary: Cholecystectomy.  Otherwise unremarkable.  Pancreas: Stable fatty atrophy of the pancreas.  Spleen: Unremarkable  Adrenals/Urinary Tract: Both adrenal glands appear normal.  2 mm right kidney lower pole nonobstructive renal calculus. No other urinary tract calculi are identified.  Stomach/Bowel: Scattered diverticula of the descending and sigmoid colon.  Vascular/Lymphatic: Atherosclerosis is present, including aortoiliac atherosclerotic disease.  Reproductive: Unremarkable  Other: No supplemental non-categorized findings.  Musculoskeletal: Lumbar spondylosis and degenerative disc disease causing multilevel impingement.  IMPRESSION: 1. 2 mm right kidney lower pole nonobstructive renal calculus. No other urinary tract calculi are identified. 2. Lumbar spondylosis and degenerative disc disease causing multilevel impingement. 3. Aortoiliac atherosclerosis.  Aortic Atherosclerosis (ICD10-I70.0).   Electronically Signed By: Ryan Salvage M.D. On: 07/17/2023 10:36   Assessment & Plan:    1. Nephrolithiasis (Primary) Followup 6 months with KUB - Urinalysis, Routine w reflex  microscopic   No follow-ups on file.  Belvie Clara, MD  [facility] [place]

## 2024-04-22 ENCOUNTER — Ambulatory Visit (INDEPENDENT_AMBULATORY_CARE_PROVIDER_SITE_OTHER)

## 2024-04-22 ENCOUNTER — Ambulatory Visit
Admission: EM | Admit: 2024-04-22 | Discharge: 2024-04-22 | Disposition: A | Discharge status: Home / Self Care | Attending: Nurse Practitioner | Admitting: Nurse Practitioner

## 2024-04-22 DIAGNOSIS — M25462 Effusion, left knee: Secondary | ICD-10-CM | POA: Diagnosis not present

## 2024-04-22 DIAGNOSIS — M25562 Pain in left knee: Secondary | ICD-10-CM

## 2024-04-22 DIAGNOSIS — M1712 Unilateral primary osteoarthritis, left knee: Secondary | ICD-10-CM | POA: Diagnosis not present

## 2024-04-22 MED ORDER — DICLOFENAC SODIUM 1 % EX GEL: 4.0000 g | Freq: Four times a day (QID) | CUTANEOUS | 0 refills | Status: AC

## 2024-04-22 MED ORDER — PREDNISONE 20 MG PO TABS: 40.0000 mg | ORAL_TABLET | Freq: Every day | ORAL | 0 refills | Status: AC

## 2024-04-22 MED ORDER — DEXAMETHASONE SODIUM PHOSPHATE 10 MG/ML IJ SOLN
10.0000 mg | INTRAMUSCULAR | Status: AC
  Administered 2024-04-22: 10 mg via INTRAMUSCULAR

## 2024-04-22 NOTE — Discharge Instructions (Addendum)
 X-ray of the left knee was negative for fracture or dislocation.  It does show a small effusion or fluid on the knee along with degenerative changes. Take medication as prescribed. As discussed, hold your Celebrex while you are taking the prednisone .  Once you complete the prednisone , you can resume Celebrex. RICE therapy, rest, ice, compression, and elevation.  Apply ice for 20 minutes, remove for 1 hour, repeat as needed. Wear the Ace wrap when you are engaged in prolonged or strenuous activity to provide additional compression and support. If your symptoms do not improve over the next 3 to 4 weeks, or appear to be worsening, it is recommended that you follow-up with your primary care physician or with orthopedics for further evaluation. Follow-up as needed.

## 2024-04-22 NOTE — ED Provider Notes (Signed)
 [facility]    CSN: [id number] Arrival date & time: 04/22/24  1103      History   Chief Complaint No chief complaint on file.   HPI Lauren Sullivan is a 73 y.o. female.   The history is provided by the patient.   Patient presents with a 1 week history of left knee pain.  Patient denies any obvious known injury or trauma.  States that she did have lithotripsy procedure when symptoms started shortly thereafter.  Patient complains of pain, swelling, and tenderness to the left knee.  She states symptoms are usually better first thing in the mornings, but worsens during the day as she is up on the knee.  She also states that she has had increased pain with ambulation.  She denies numbness, tingling, or radiation of pain.  Patient states that she does have a history of arthritis, but has never had any problems with her left knee.  States that she has used ice, elevation, and has taken prednisone  and Tylenol  for her symptoms.  Past Medical History:  Diagnosis Date   Arthritis    Blood transfusion without reported diagnosis    Breast CA (HCC)    Cataract    Diabetes mellitus without complication (HCC)    GERD (gastroesophageal reflux disease)    History of kidney stones    Hypertension     Patient Active Problem List   Diagnosis Date Noted   Palpitations 01/08/2023   History of mitral valve prolapse 01/08/2023   OSA (obstructive sleep apnea) 01/08/2023   Decreased grip strength 02/03/2022   Cervico-occipital neuralgia of left side 02/03/2022   Complaining of cold hands 02/03/2022   Numbness and tingling in both hands 02/03/2022   Fatty liver 04/11/2020   Left sided abdominal pain 11/21/2019   Rectal bleeding 11/21/2019   Menopause 11/07/2019   Encounter for screening mammogram for malignant neoplasm of breast 11/07/2019   Diarrhea 11/07/2019   Hematochezia 11/07/2019   Controlled type 2 diabetes mellitus without complication, without long-term current use of insulin  (HCC) 11/07/2019   Breast CA (HCC)    BREAST CANCER 10/16/2009   MIGRAINE HEADACHE 10/16/2009   NEUROPATHY 10/16/2009   Hypertension complicating diabetes (HCC) 10/16/2009   GERD 10/16/2009   Arthritis of both hands 10/16/2009    Past Surgical History:  Procedure Laterality Date   BIOPSY  01/02/2020   Procedure: BIOPSY;  Surgeon: Shaaron Lamar HERO, MD;  [facility];  Service: Endoscopy;;  colon   BREAST BIOPSY Left 01/19/2023   MM LT BREAST BX W LOC DEV 1ST LESION IMAGE BX SPEC STEREO GUIDE 01/19/2023 [facility]   BREAST SURGERY     CHOLECYSTECTOMY     COLONOSCOPY  2008   Dr. Shaaron: Minimally friable anal canal, patient only boggy edematous terminal ileum.  Biopsies from the terminal ileum were benign.  Random colon biopsies benign.  No evidence of microscopic colitis or inflammatory bowel disease.   COLONOSCOPY WITH PROPOFOL  N/A 01/02/2020   Rourk: 2 tubular adenomas removed, random colon biopsies negative.  Next colonoscopy 5 years.   EXTRACORPOREAL SHOCK WAVE LITHOTRIPSY Left 04/05/2024   Procedure: LITHOTRIPSY, ESWL;  Surgeon: Sherrilee Belvie CROME, MD;  Location: [facility];  Service: Urology;  Laterality: Left;   EYE SURGERY Bilateral    MASTECTOMY Right    POLYPECTOMY  01/02/2020   Procedure: POLYPECTOMY;  Surgeon: Shaaron Lamar HERO, MD;  [facility];  Service: Endoscopy;;   TUBAL LIGATION  OB History   No obstetric history on file.      Home Medications    Prior to Admission medications   Medication Sig Start Date End Date Taking? Authorizing Provider  diclofenac  Sodium (VOLTAREN  ARTHRITIS PAIN) 1 % GEL Apply 4 g topically 4 (four) times daily. 04/22/24  Yes Leath-Warren, Etta PARAS, NP  predniSONE  (DELTASONE ) 20 MG tablet Take 2 tablets (40 mg total) by mouth daily with breakfast for 5 days. 04/22/24 04/27/24 Yes Leath-Warren, Etta PARAS, NP  acetaminophen  (TYLENOL ) 500 MG tablet Take 1,000 mg by mouth every 6 (six) hours as needed for headache.     [provider]  amLODipine (NORVASC) 5 MG tablet Take 10 mg by mouth daily. 01/27/22   [provider]  celecoxib (CELEBREX) 100 MG capsule Take 100 mg by mouth daily. 01/03/23   [provider]  ezetimibe (ZETIA) 10 MG tablet Take 10 mg by mouth daily. 12/15/22   [provider]  fluconazole (DIFLUCAN) 150 MG tablet Take 150 mg by mouth. As needed    [provider]  losartan (COZAAR) 25 MG tablet Take 25 mg by mouth daily.    [provider]  metoprolol  tartrate (LOPRESSOR ) 50 MG tablet Take 1 tablet (50 mg total) by mouth 2 (two) times daily. 10/26/23 04/05/24  Jeffrie Oneil BROCKS, MD  nitrofurantoin  (MACRODANTIN ) 50 MG capsule Take 1 capsule (50 mg total) by mouth at bedtime. 03/28/24   McKenzie, Belvie CROME, MD  nortriptyline (PAMELOR) 50 MG capsule Take 50 mg by mouth daily.     [provider]  ondansetron  (ZOFRAN ) 4 MG tablet Take 1 tablet (4 mg total) by mouth daily as needed for nausea or vomiting. 04/05/24 04/05/25  McKenzie, Belvie CROME, MD  pantoprazole (PROTONIX) 40 MG tablet Take 40 mg by mouth daily. 10/27/22   [provider]  RYBELSUS 14 MG TABS Take 1 tablet by mouth every morning. 12/24/22   [provider]  tamsulosin  (FLOMAX ) 0.4 MG CAPS capsule Take 1 capsule (0.4 mg total) by mouth daily after supper. 04/05/24   McKenzie, Belvie CROME, MD    Family History Family History  Problem Relation Age of Onset   Cancer Mother        pancreatic   Heart disease Mother    Hypertension Mother    Hypertension Father    Colon cancer Neg Hx     Social History Social History   Tobacco Use   Smoking status: Former    Passive exposure: Never   Smokeless tobacco: Never  Vaping Use   Vaping status: Never Used  Substance Use Topics   Alcohol use: Never   Drug use: Never     Allergies   Morphine, Tape, and Vancomycin   Review of Systems Review of Systems Per HPI  Physical Exam Triage Vital Signs ED Triage  Vitals  Encounter Vitals Group     BP 04/22/24 1128 (!) 161/87     Girls Systolic BP Percentile --      Girls Diastolic BP Percentile --      Boys Systolic BP Percentile --      Boys Diastolic BP Percentile --      Pulse Rate 04/22/24 1128 81     Resp 04/22/24 1128 16     Temp 04/22/24 1128 97.9 F (36.6 C)     Temp Source 04/22/24 1128 Oral     SpO2 04/22/24 1128 96 %     Weight --      Height --  Head Circumference --      Peak Flow --      Pain Score 04/22/24 1127 6     Pain Loc --      Pain Education --      Exclude from Growth Chart --    No data found.  Updated Vital Signs BP (!) 161/87 (BP Location: Right Arm)   Pulse 81   Temp 97.9 F (36.6 C) (Oral)   Resp 16   SpO2 96%   Visual Acuity Right Eye Distance:   Left Eye Distance:   Bilateral Distance:    Right Eye Near:   Left Eye Near:    Bilateral Near:     Physical Exam Vitals and nursing note reviewed.  Constitutional:      General: She is not in acute distress.    Appearance: Normal appearance.  HENT:     Head: Normocephalic.  Eyes:     Extraocular Movements: Extraocular movements intact.     Pupils: Pupils are equal, round, and reactive to light.  Cardiovascular:     Rate and Rhythm: Normal rate and regular rhythm.  Musculoskeletal:     Left knee: Swelling present. No deformity. Decreased range of motion. Tenderness present over the medial joint line and MCL. Normal pulse.  Skin:    General: Skin is warm and dry.  Neurological:     General: No focal deficit present.     Mental Status: She is alert and oriented to person, place, and time.  Psychiatric:        Mood and Affect: Mood normal.        Behavior: Behavior normal.      UC Treatments / Results  Labs (all labs ordered are listed, but only abnormal results are displayed) Labs Reviewed - No data to display  EKG   Radiology DG Knee Complete 4 Views Left Result Date: 04/22/2024 CLINICAL DATA:  Left knee pain and swelling for  1 week. EXAM: LEFT KNEE - COMPLETE 4+ VIEW COMPARISON:  None Available. FINDINGS: The bones appear mildly demineralized. No evidence of acute fracture or dislocation. There is a small knee joint effusion. There are mild tricompartmental degenerative changes, greatest within the medial compartment. No focal soft tissue abnormalities are identified. IMPRESSION: Mild tricompartmental degenerative changes with small knee joint effusion. No acute osseous findings. Electronically Signed   By: Elsie Perone M.D.   On: 04/22/2024 12:05    Procedures Procedures (including critical care time)  Medications Ordered in UC Medications  dexamethasone  (DECADRON ) injection 10 mg (10 mg Intramuscular Given 04/22/24 1204)    Initial Impression / Assessment and Plan / UC Course  I have reviewed the triage vital signs and the nursing notes.  Pertinent labs & imaging results that were available during my care of the patient were reviewed by me and considered in my medical decision making (see chart for details).  The x-ray is negative for fracture or dislocation but does show degenerative changes with a small knee joint effusion.  Decadron  10 mg IM administered along with an Ace wrap applied for compression and support.  Patient was also started on Voltaren  gel to apply topically to the affected area.  Supportive care recommendations were provided and discussed with the patient to include continuing the use of heat or ice, elevating the lower extremity, and over-the-counter Tylenol  for pain or discomfort.  Patient was advised regarding follow-up.  Patient was in agreement with this plan of care and verbalizes understanding.  All questions were answered.  Patient stable for discharge.   Final Clinical Impressions(s) / UC Diagnoses   Final diagnoses:  Left knee pain, unspecified chronicity     Discharge Instructions      X-ray of the left knee was negative for fracture or dislocation. Take medication as  prescribed. As discussed, hold your Celebrex while you are taking the prednisone .  Once you complete the prednisone , you can resume Celebrex. RICE therapy, rest, ice, compression, and elevation.  Apply ice for 20 minutes, remove for 1 hour, repeat as needed. Wear the Ace wrap when you are engaged in prolonged or strenuous activity to provide additional compression and support. If your symptoms do not improve over the next 3 to 4 weeks, or appear to be worsening, it is recommended that you follow-up with your primary care physician or with orthopedics for further evaluation. Follow-up as needed.      ED Prescriptions     Medication Sig Dispense Auth. Provider   predniSONE  (DELTASONE ) 20 MG tablet Take 2 tablets (40 mg total) by mouth daily with breakfast for 5 days. 10 tablet Leath-Warren, Etta PARAS, NP   diclofenac  Sodium (VOLTAREN  ARTHRITIS PAIN) 1 % GEL Apply 4 g topically 4 (four) times daily. 150 g Leath-Warren, Etta PARAS, NP      PDMP not reviewed this encounter.   Gilmer Etta PARAS, NP 04/22/24 [phone]

## 2024-04-22 NOTE — ED Triage Notes (Signed)
 Pt reports she has left knee pain after getting a lythatripcy performed x 1 week

## 2024-05-04 DIAGNOSIS — E118 Type 2 diabetes mellitus with unspecified complications: Secondary | ICD-10-CM | POA: Diagnosis not present

## 2024-05-04 DIAGNOSIS — E782 Mixed hyperlipidemia: Secondary | ICD-10-CM | POA: Diagnosis not present

## 2024-05-11 DIAGNOSIS — R001 Bradycardia, unspecified: Secondary | ICD-10-CM | POA: Diagnosis not present

## 2024-05-11 DIAGNOSIS — G130 Paraneoplastic neuromyopathy and neuropathy: Secondary | ICD-10-CM | POA: Diagnosis not present

## 2024-05-11 DIAGNOSIS — K719 Toxic liver disease, unspecified: Secondary | ICD-10-CM | POA: Diagnosis not present

## 2024-05-11 DIAGNOSIS — I10 Essential (primary) hypertension: Secondary | ICD-10-CM | POA: Diagnosis not present

## 2024-05-11 DIAGNOSIS — E118 Type 2 diabetes mellitus with unspecified complications: Secondary | ICD-10-CM | POA: Diagnosis not present

## 2024-05-11 DIAGNOSIS — G720 Drug-induced myopathy: Secondary | ICD-10-CM | POA: Diagnosis not present

## 2024-05-11 DIAGNOSIS — R945 Abnormal results of liver function studies: Secondary | ICD-10-CM | POA: Diagnosis not present

## 2024-05-11 DIAGNOSIS — Z853 Personal history of malignant neoplasm of breast: Secondary | ICD-10-CM | POA: Diagnosis not present

## 2024-05-11 DIAGNOSIS — K219 Gastro-esophageal reflux disease without esophagitis: Secondary | ICD-10-CM | POA: Diagnosis not present

## 2024-05-11 DIAGNOSIS — R002 Palpitations: Secondary | ICD-10-CM | POA: Diagnosis not present

## 2024-05-11 DIAGNOSIS — E782 Mixed hyperlipidemia: Secondary | ICD-10-CM | POA: Diagnosis not present

## 2024-05-11 DIAGNOSIS — N202 Calculus of kidney with calculus of ureter: Secondary | ICD-10-CM | POA: Diagnosis not present

## 2024-05-26 DIAGNOSIS — C50911 Malignant neoplasm of unspecified site of right female breast: Secondary | ICD-10-CM | POA: Diagnosis not present

## 2024-05-30 ENCOUNTER — Ambulatory Visit: Admitting: Orthopedic Surgery

## 2024-06-14 DIAGNOSIS — C50911 Malignant neoplasm of unspecified site of right female breast: Secondary | ICD-10-CM | POA: Diagnosis not present

## 2024-08-05 ENCOUNTER — Ambulatory Visit
Admission: EM | Admit: 2024-08-05 | Discharge: 2024-08-05 | Disposition: A | Discharge status: Home / Self Care | Attending: Family Medicine | Admitting: Family Medicine

## 2024-08-05 DIAGNOSIS — M25562 Pain in left knee: Secondary | ICD-10-CM | POA: Diagnosis not present

## 2024-08-05 MED ORDER — DEXAMETHASONE SOD PHOSPHATE PF 10 MG/ML IJ SOLN
10.0000 mg | Freq: Once | INTRAMUSCULAR | Status: AC
  Administered 2024-08-05: 10 mg via INTRAMUSCULAR

## 2024-08-05 NOTE — Discharge Instructions (Signed)
 We have given you a steroid shot today for pain and inflammation and in addition you may use ice, elevation, compression wraps and follow-up with orthopedics as scheduled.

## 2024-08-05 NOTE — ED Provider Notes (Signed)
 [facility]    CSN: [id number] Arrival date & time: 08/05/24  1158      History   Chief Complaint No chief complaint on file.   HPI LIONA WENGERT is a 73 y.o. female.   Patient presenting today with 2-day history of progressively worsening acute on chronic left knee pain, swelling medially.  States she has been dealing with this off and on for the past few months and has had x-rays just showing the fluid accumulation.  Denies known injury, new activity, discoloration, numbness, tingling, weakness.  Trying Tylenol  with minimal relief.    Past Medical History:  Diagnosis Date   Arthritis    Blood transfusion without reported diagnosis    Breast CA (HCC)    Cataract    Diabetes mellitus without complication (HCC)    GERD (gastroesophageal reflux disease)    History of kidney stones    Hypertension     Patient Active Problem List   Diagnosis Date Noted   Bradycardia 01/11/2024   Palpitations 01/08/2023   History of mitral valve prolapse 01/08/2023   OSA (obstructive sleep apnea) 01/08/2023   Adenomatous polyp of colon 12/15/2022   Candidal intertrigo 12/15/2022   Acute urinary tract infection 11/05/2022   Calculus of kidney and ureter 11/05/2022   Complication of diabetes mellitus (HCC) 08/20/2022   Onychomycosis of toenail 08/20/2022   Benign paroxysmal positional vertigo 05/12/2022   Myalgia due to HMG CoA reductase inhibitor 05/12/2022   Decreased grip strength 02/03/2022   Cervico-occipital neuralgia of left side 02/03/2022   Complaining of cold hands 02/03/2022   Numbness and tingling in both hands 02/03/2022   Abnormal results of liver function studies 12/02/2021   Neck pain 12/02/2021   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   Proteinuria 12/02/2021   Fatty liver 04/11/2020   Left sided abdominal pain 11/21/2019   Rectal bleeding 11/21/2019   Menopause 11/07/2019   Encounter for screening mammogram for malignant neoplasm of breast 11/07/2019    Diarrhea 11/07/2019   Hematochezia 11/07/2019   Controlled type 2 diabetes mellitus without complication, without long-term current use of insulin (HCC) 11/07/2019   Myofascial pain 07/31/2014   Breast CA (HCC)    BREAST CANCER 10/16/2009   MIGRAINE HEADACHE 10/16/2009   NEUROPATHY 10/16/2009   Hypertension complicating diabetes (HCC) 10/16/2009   GERD 10/16/2009   Arthritis of both hands 10/16/2009    Past Surgical History:  Procedure Laterality Date   BIOPSY  01/02/2020   Procedure: BIOPSY;  Surgeon: Shaaron Lamar HERO, MD;  [facility]: [facility];  Service: Endoscopy;;  colon   BREAST BIOPSY Left 01/19/2023   MM LT BREAST BX W LOC DEV 1ST LESION IMAGE BX SPEC STEREO GUIDE 01/19/2023 [facility]   BREAST SURGERY     CHOLECYSTECTOMY     COLONOSCOPY  2008   Dr. Shaaron: Minimally friable anal canal, patient only boggy edematous terminal ileum.  Biopsies from the terminal ileum were benign.  Random colon biopsies benign.  No evidence of microscopic colitis or inflammatory bowel disease.   COLONOSCOPY WITH PROPOFOL  N/A 01/02/2020   Rourk: 2 tubular adenomas removed, random colon biopsies negative.  Next colonoscopy 5 years.   EXTRACORPOREAL SHOCK WAVE LITHOTRIPSY Left 04/05/2024   Procedure: LITHOTRIPSY, ESWL;  Surgeon: Sherrilee Belvie CROME, MD;  Location: [facility];  Service: Urology;  Laterality: Left;   EYE SURGERY Bilateral    MASTECTOMY Right    POLYPECTOMY  01/02/2020   Procedure: POLYPECTOMY;  Surgeon: Shaaron Lamar HERO, MD;  Location: [facility];  Service: Endoscopy;;   TUBAL LIGATION      OB History   No obstetric history on file.      Home Medications    Prior to Admission medications   Medication Sig Start Date End Date Taking? Authorizing Provider  acetaminophen  (TYLENOL ) 500 MG tablet Take 1,000 mg by mouth every 6 (six) hours as needed for headache.    [provider]  amLODipine (NORVASC) 5 MG tablet Take 10 mg by mouth daily. 01/27/22   [provider]  celecoxib (CELEBREX) 100 MG capsule Take 100 mg by mouth daily. 01/03/23   [provider]  diclofenac  Sodium (VOLTAREN  ARTHRITIS PAIN) 1 % GEL Apply 4 g topically 4 (four) times daily. 04/22/24   Leath-Warren, Etta PARAS, NP  ezetimibe (ZETIA) 10 MG tablet Take 10 mg by mouth daily. 12/15/22   [provider]  fluconazole (DIFLUCAN) 150 MG tablet Take 150 mg by mouth. As needed    [provider]  losartan (COZAAR) 25 MG tablet Take 25 mg by mouth daily.    [provider]  metoprolol  tartrate (LOPRESSOR ) 50 MG tablet Take 1 tablet (50 mg total) by mouth 2 (two) times daily. 10/26/23 04/05/24  Jeffrie Oneil BROCKS, MD  nitrofurantoin  (MACRODANTIN ) 50 MG capsule Take 1 capsule (50 mg total) by mouth at bedtime. 03/28/24   McKenzie, Belvie CROME, MD  nortriptyline (PAMELOR) 50 MG capsule Take 50 mg by mouth daily.     [provider]  ondansetron  (ZOFRAN ) 4 MG tablet Take 1 tablet (4 mg total) by mouth daily as needed for nausea or vomiting. 04/05/24 04/05/25  McKenzie, Belvie CROME, MD  pantoprazole (PROTONIX) 40 MG tablet Take 40 mg by mouth daily. 10/27/22   [provider]  RYBELSUS 14 MG TABS Take 1 tablet by mouth every morning. 12/24/22   [provider]  tamsulosin  (FLOMAX ) 0.4 MG CAPS capsule Take 1 capsule (0.4 mg total) by mouth daily after supper. 04/05/24   McKenzie, Belvie CROME, MD    Family History Family History  Problem Relation Age of Onset   Cancer Mother        pancreatic   Heart disease Mother    Hypertension Mother    Hypertension Father    Colon cancer Neg Hx     Social History Social History   Tobacco Use   Smoking status: Former    Passive exposure: Never   Smokeless tobacco: Never  Vaping Use   Vaping status: Never Used  Substance Use Topics   Alcohol use: Never   Drug use: Never     Allergies   Morphine, Tape, and Vancomycin   Review of Systems Review of Systems Per HPI  Physical Exam Triage  Vital Signs ED Triage Vitals  Encounter Vitals Group     BP 08/05/24 1256 (!) 158/83     Girls Systolic BP Percentile --      Girls Diastolic BP Percentile --      Boys Systolic BP Percentile --      Boys Diastolic BP Percentile --      Pulse Rate 08/05/24 1256 81     Resp 08/05/24 1256 18     Temp 08/05/24 1256 98 F (36.7 C)     Temp Source 08/05/24 1256 Oral     SpO2 08/05/24 1256 96 %     Weight --      Height --      Head Circumference --  Peak Flow --      Pain Score 08/05/24 1254 6     Pain Loc --      Pain Education --      Exclude from Growth Chart --    No data found.  Updated Vital Signs BP (!) 158/83 (BP Location: Right Arm)   Pulse 81   Temp 98 F (36.7 C) (Oral)   Resp 18   SpO2 96%   Visual Acuity Right Eye Distance:   Left Eye Distance:   Bilateral Distance:    Right Eye Near:   Left Eye Near:    Bilateral Near:     Physical Exam Vitals and nursing note reviewed.  Constitutional:      Appearance: Normal appearance. She is not ill-appearing.  HENT:     Head: Atraumatic.  Eyes:     Extraocular Movements: Extraocular movements intact.     Conjunctiva/sclera: Conjunctivae normal.  Cardiovascular:     Rate and Rhythm: Normal rate.  Pulmonary:     Effort: Pulmonary effort is normal.  Musculoskeletal:        General: Swelling and tenderness present. No signs of injury. Normal range of motion.     Cervical back: Normal range of motion and neck supple.     Comments: Trace edema, medial tenderness to palpation to the left knee.  Range of motion intact, no discoloration  Skin:    General: Skin is warm and dry.     Findings: No bruising or erythema.  Neurological:     Mental Status: She is alert and oriented to person, place, and time.     Comments: Left lower extremity neurovascularly intact  Psychiatric:        Mood and Affect: Mood normal.        Thought Content: Thought content normal.        Judgment: Judgment normal.      UC  Treatments / Results  Labs (all labs ordered are listed, but only abnormal results are displayed) Labs Reviewed - No data to display  EKG   Radiology No results found.  Procedures Procedures (including critical care time)  Medications Ordered in UC Medications  dexamethasone  (DECADRON ) injection 10 mg (10 mg Intramuscular Given 08/05/24 1324)    Initial Impression / Assessment and Plan / UC Course  I have reviewed the triage vital signs and the nursing notes.  Pertinent labs & imaging results that were available during my care of the patient were reviewed by me and considered in my medical decision making (see chart for details).     Suspect inflammatory cause, likely arthritis flare.  Has appointment with orthopedics in the next week or so, in the meantime treat with IM Decadron , RICE, over-the-counter pain relievers as needed.  Return for worsening symptoms.  Final Clinical Impressions(s) / UC Diagnoses   Final diagnoses:  Acute pain of left knee     Discharge Instructions      We have given you a steroid shot today for pain and inflammation and in addition you may use ice, elevation, compression wraps and follow-up with orthopedics as scheduled.    ED Prescriptions   None    PDMP not reviewed this encounter.   Stuart Vernell Norris, [place] 08/05/24 1402

## 2024-08-05 NOTE — ED Triage Notes (Signed)
 Pt reports she has left knee pain 2 days. Pain increases with walking. States she has a spasm feeling in the left leg that goes up to her hip.  Took tylenol 

## 2024-08-08 DIAGNOSIS — H43813 Vitreous degeneration, bilateral: Secondary | ICD-10-CM | POA: Diagnosis not present

## 2024-08-08 DIAGNOSIS — H04123 Dry eye syndrome of bilateral lacrimal glands: Secondary | ICD-10-CM | POA: Diagnosis not present

## 2024-08-08 DIAGNOSIS — Z961 Presence of intraocular lens: Secondary | ICD-10-CM | POA: Diagnosis not present

## 2024-08-08 DIAGNOSIS — H35371 Puckering of macula, right eye: Secondary | ICD-10-CM | POA: Diagnosis not present

## 2024-08-17 ENCOUNTER — Encounter: Payer: Self-pay | Admitting: Orthopedic Surgery

## 2024-08-17 ENCOUNTER — Ambulatory Visit: Admitting: Orthopedic Surgery

## 2024-08-17 VITALS — BP 102/53 | HR 55 | Ht 66.0 in | Wt 200.0 lb

## 2024-08-17 DIAGNOSIS — M1712 Unilateral primary osteoarthritis, left knee: Secondary | ICD-10-CM | POA: Diagnosis not present

## 2024-08-17 MED ORDER — METHYLPREDNISOLONE ACETATE 40 MG/ML IJ SUSP
40.0000 mg | Freq: Once | INTRAMUSCULAR | Status: AC
  Administered 2024-08-17: 40 mg via INTRA_ARTICULAR

## 2024-08-17 MED ORDER — MELOXICAM 7.5 MG PO TABS: 7.5000 mg | ORAL_TABLET | Freq: Every day | ORAL | 5 refills | Status: AC

## 2024-08-17 NOTE — Progress Notes (Signed)
 Office Visit Note   Patient: Lauren Sullivan           Date of Birth: 1951/08/18           MRN: [id number] Visit Date: 08/17/2024 Requested by: Shona Norleen PEDLAR, MD [address] [name],  [place] [address] PCP: Shona Norleen PEDLAR, MD   Assessment & Plan:   Encounter Diagnosis  Name Primary?   Primary osteoarthritis of left knee Yes    Meds ordered this encounter  Medications   methylPREDNISolone acetate (DEPO-MEDROL) injection 40 mg   meloxicam (MOBIC) 7.5 MG tablet    Sig: Take 1 tablet (7.5 mg total) by mouth daily.    Dispense:  30 tablet    Refill:  54    73 year old female with osteoarthritis grade 3 left knee new increased pain brought on by procedure?  Recommend nonoperative treatment  Start meloxicam 1 a day May take 500 mg Tylenol  every 6 hours as needed Cortisone injection Recheck 6 weeks   Procedure note left knee injection   verbal consent was obtained to inject left knee joint  Timeout was completed to confirm the site of injection  The medications used were depomedrol 40 mg and 1% lidocaine  3 cc Anesthesia was provided by ethyl chloride and the skin was prepped with alcohol.  After cleaning the skin with alcohol a 20-gauge needle was used to inject the left knee joint. There were no complications. A sterile bandage was applied.     Subjective: Chief Complaint  Patient presents with   Knee Pain    Left     HPI: 73 year old female who had lithotripsy after lithotripsy the left knee started swelling and hurting  The pain is located over the medial side of the knee  She has been to urgent care twice She had 2 IM injections and 1 oral course of prednisone   Initially she got good relief from the IM injection and the oral prednisone  but the second IM injection did not relieve the pain              ROS: Currently no other musculoskeletal issues   Images personally read and my interpretation : Outside imaging osteoarthritis grade 3 medial compartment  primarily left knee  Visit Diagnoses:  1. Primary osteoarthritis of left knee      Follow-Up Instructions: Return in about 6 weeks (around 09/28/2024) for FOLLOW UP, LEFT, KNEE-OA.    Objective: Vital Signs: BP (!) 102/53   Pulse (!) 55   Ht 5' 6 (1.676 m)   Wt 200 lb (90.7 kg)   BMI 32.28 kg/m   Physical Exam Vitals and nursing note reviewed.  Constitutional:      Appearance: Normal appearance.  HENT:     Head: Normocephalic and atraumatic.  Eyes:     General: No scleral icterus.       Right eye: No discharge.        Left eye: No discharge.     Extraocular Movements: Extraocular movements intact.     Conjunctiva/sclera: Conjunctivae normal.     Pupils: Pupils are equal, round, and reactive to light.  Cardiovascular:     Rate and Rhythm: Normal rate.     Pulses: Normal pulses.  Skin:    General: Skin is warm and dry.     Capillary Refill: Capillary refill takes less than 2 seconds.  Neurological:     General: No focal deficit present.     Mental Status: She is alert and oriented  to person, place, and time.  Psychiatric:        Mood and Affect: Mood normal.        Behavior: Behavior normal.        Thought Content: Thought content normal.        Judgment: Judgment normal.      Left Knee Exam   Muscle Strength  The patient has normal left knee strength.  Tenderness  The patient is experiencing tenderness in the medial joint line.  Range of Motion  Left knee extension: 5.  Flexion:  120   Tests  Varus: negative Valgus: negative Drawer:  Anterior - negative     Posterior - negative  Other  Erythema: absent Scars: absent Sensation: normal Pulse: present Swelling: none       Specialty Comments:  No specialty comments available.  Imaging: No results found.   PMFS History: Patient Active Problem List   Diagnosis Date Noted   Bradycardia 01/11/2024   Palpitations 01/08/2023   History of mitral valve prolapse 01/08/2023   OSA  (obstructive sleep apnea) 01/08/2023   Adenomatous polyp of colon 12/15/2022   Candidal intertrigo 12/15/2022   Acute urinary tract infection 11/05/2022   Calculus of kidney and ureter 11/05/2022   Complication of diabetes mellitus (HCC) 08/20/2022   Onychomycosis of toenail 08/20/2022   Benign paroxysmal positional vertigo 05/12/2022   Myalgia due to HMG CoA reductase inhibitor 05/12/2022   Decreased grip strength 02/03/2022   Cervico-occipital neuralgia of left side 02/03/2022   Complaining of cold hands 02/03/2022   Numbness and tingling in both hands 02/03/2022   Abnormal results of liver function studies 12/02/2021   Neck pain 12/02/2021   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   Proteinuria 12/02/2021   Fatty liver 04/11/2020   Left sided abdominal pain 11/21/2019   Rectal bleeding 11/21/2019   Menopause 11/07/2019   Encounter for screening mammogram for malignant neoplasm of breast 11/07/2019   Diarrhea 11/07/2019   Hematochezia 11/07/2019   Controlled type 2 diabetes mellitus without complication, without long-term current use of insulin (HCC) 11/07/2019   Myofascial pain 07/31/2014   Breast CA (HCC)    BREAST CANCER 10/16/2009   MIGRAINE HEADACHE 10/16/2009   NEUROPATHY 10/16/2009   Hypertension complicating diabetes (HCC) 10/16/2009   GERD 10/16/2009   Arthritis of both hands 10/16/2009   Past Medical History:  Diagnosis Date   Arthritis    Blood transfusion without reported diagnosis    Breast CA (HCC)    Cataract    Diabetes mellitus without complication (HCC)    GERD (gastroesophageal reflux disease)    History of kidney stones    Hypertension     Family History  Problem Relation Age of Onset   Cancer Mother        pancreatic   Heart disease Mother    Hypertension Mother    Hypertension Father    Colon cancer Neg Hx     Past Surgical History:  Procedure Laterality Date   BIOPSY  01/02/2020   Procedure: BIOPSY;  Surgeon: Shaaron Lamar HERO, MD;   [facility];  Service: Endoscopy;;  colon   BREAST BIOPSY Left 01/19/2023   MM LT BREAST BX W LOC DEV 1ST LESION IMAGE BX SPEC STEREO GUIDE 01/19/2023 [facility]   BREAST SURGERY     CHOLECYSTECTOMY     COLONOSCOPY  2008   Dr. Shaaron: Minimally friable anal canal, patient only boggy edematous terminal ileum.  Biopsies from the terminal ileum were benign.  Random  colon biopsies benign.  No evidence of microscopic colitis or inflammatory bowel disease.   COLONOSCOPY WITH PROPOFOL  N/A 01/02/2020   Rourk: 2 tubular adenomas removed, random colon biopsies negative.  Next colonoscopy 5 years.   EXTRACORPOREAL SHOCK WAVE LITHOTRIPSY Left 04/05/2024   Procedure: LITHOTRIPSY, ESWL;  Surgeon: Sherrilee Belvie CROME, MD;  Location: [facility];  Service: Urology;  Laterality: Left;   EYE SURGERY Bilateral    MASTECTOMY Right    POLYPECTOMY  01/02/2020   Procedure: POLYPECTOMY;  Surgeon: Shaaron Lamar HERO, MD;  Location: [facility];  Service: Endoscopy;;   TUBAL LIGATION     Social History   Occupational History   Occupation: part time [employer] operational officer  Tobacco Use   Smoking status: Former    Passive exposure: Never   Smokeless tobacco: Never  Vaping Use   Vaping status: Never Used  Substance and Sexual Activity   Alcohol use: Never   Drug use: Never   Sexual activity: Yes

## 2024-08-17 NOTE — Patient Instructions (Signed)
 Take 500 mg of Tylenol  every 6 hours joint Steroid Injection  Start meloxicam 1 a day for arthritis   A joint steroid injection is a procedure to relieve swelling and pain in a joint. Steroids are medicines that reduce inflammation. In this procedure, your health care provider uses a syringe and a needle to inject a steroid medicine into a painful and inflamed joint. A pain-relieving medicine (anesthetic) may be injected along with the steroid. In some cases, your health care provider may use an imaging technique such as ultrasound or fluoroscopy to guide the injection. Joints that are often treated with steroid injections include the knee, shoulder, hip, and spine. These injections may also be used in the elbow, ankle, and joints of the hands or feet. You may have joint steroid injections as part of your treatment for inflammation caused by: Gout. Rheumatoid arthritis. Advanced wear-and-tear arthritis (osteoarthritis). Tendinitis. Bursitis. Joint steroid injections may be repeated, but having them too often can damage a joint or the skin over the joint. You should not have joint steroid injections less than 6 weeks apart or more than four times a year. Tell a health care provider about: Any allergies you have. All medicines you are taking, including vitamins, herbs, eye drops, creams, and over-the-counter medicines. Any problems you or family members have had with anesthetic medicines. Any blood disorders you have. Any surgeries you have had. Any medical conditions you have. Whether you are pregnant or may be pregnant. What are the risks? Generally, this is a safe treatment. However, problems may occur, including: Infection. Bleeding. Allergic reactions to medicines. Damage to the joint or tissues around the joint. Thinning of skin or loss of skin color over the joint. Temporary flushing of the face or chest. Temporary increase in pain. Temporary increase in blood sugar. Failure to  relieve inflammation or pain. What happens before the treatment? Medicines Ask your health care provider about: Changing or stopping your regular medicines. This is especially important if you are taking diabetes medicines or blood thinners. Taking medicines such as aspirin and ibuprofen . These medicines can thin your blood. Do not take these medicines unless your health care provider tells you to take them. Taking over-the-counter medicines, vitamins, herbs, and supplements. General instructions You may have imaging tests of your joint. Ask your health care provider if you can drive yourself home after the procedure. What happens during the treatment?  Your health care provider will position you for the injection and locate the injection site over your joint. The skin over the joint will be cleaned with a germ-killing soap. Your health care provider may: Spray a numbing solution (topical anesthetic) over the injection site. Inject a local anesthetic under the skin above your joint. The needle will be placed through your skin into your joint. Your health care provider may use imaging to guide the needle to the right spot for the injection. If imaging is used, a special contrast dye may be injected to confirm that the needle is in the correct location. The steroid medicine will be injected into your joint. Anesthetic may be injected along with the steroid. This may be a medicine that relieves pain for a short time (short-acting anesthetic) or for a longer time (long-acting anesthetic). The needle will be removed, and an adhesive bandage (dressing) will be placed over the injection site. The procedure may vary among health care providers and hospitals. What can I expect after the treatment? You will be able to go home after the treatment. It  is normal to feel slight flushing for a few days after the injection. After the treatment, it is common to have an increase in joint pain after the  anesthetic has worn off. This may happen about an hour after a short-acting anesthetic or about 8 hours after a longer-acting anesthetic. You should begin to feel relief from joint pain and swelling after 24 to 48 hours. Contact your health care provider if you do not begin to feel relief after 2 days. Follow these instructions at home: Injection site care Leave the adhesive dressing over your injection site in place until your health care provider says you can remove it. Check your injection site every day for signs of infection. Check for: More redness, swelling, or pain. Fluid or blood. Warmth. Pus or a bad smell. Activity Return to your normal activities as told by your health care provider. Ask your health care provider what activities are safe for you. You may be asked to limit activities that put stress on the joint for a few days. Do joint exercises as told by your health care provider. Do not take baths, swim, or use a hot tub until your health care provider approves. Ask your health care provider if you may take showers. You may only be allowed to take sponge baths. Managing pain, stiffness, and swelling  If directed, put ice on the joint. To do this: Put ice in a plastic bag. Place a towel between your skin and the bag. Leave the ice on for 20 minutes, 2-3 times a day. Remove the ice if your skin turns bright red. This is very important. If you cannot feel pain, heat, or cold, you have a greater risk of damage to the area. Raise (elevate) your joint above the level of your heart when you are sitting or lying down. General instructions Take over-the-counter and prescription medicines only as told by your health care provider. Do not use any products that contain nicotine or tobacco, such as cigarettes, e-cigarettes, and chewing tobacco. These can delay joint healing. If you need help quitting, ask your health care provider. If you have diabetes, be aware that your blood sugar may  be slightly elevated for several days after the injection. Keep all follow-up visits. This is important. Contact a health care provider if you have: Chills or a fever. Any signs of infection at your injection site. Increased pain or swelling or no relief after 2 days. Summary A joint steroid injection is a treatment to relieve pain and swelling in a joint. Steroids are medicines that reduce inflammation. Your health care provider may add an anesthetic along with the steroid. You may have joint steroid injections as part of your arthritis treatment. Joint steroid injections may be repeated, but having them too often can damage a joint or the skin over the joint. Contact your health care provider if you have a fever, chills, or signs of infection, or if you get no relief from joint pain or swelling. This information is not intended to replace advice given to you by your health care provider. Make sure you discuss any questions you have with your health care provider. Document Revised: 03/16/2020 Document Reviewed: 03/16/2020 Elsevier Patient Education  2024 [employer].

## 2024-08-17 NOTE — Progress Notes (Signed)
  Intake history:  Chief Complaint  Patient presents with   Knee Pain    Left      BP (!) 102/53   Pulse (!) 55   Ht 5' 6 (1.676 m)   Wt 200 lb (90.7 kg)   BMI 32.28 kg/m  Body mass index is 32.28 kg/m.  Pharmacy? ______[facility]  WHAT ARE WE SEEING YOU FOR TODAY?   Left knee pain / right has started too recently / history of Right knee scope Dr Margrette   How long has this bothered you? (DOI?DOS?WS?)  Since July   Was there an injury? No Started after litotripsy  Anticoag.  No   Any ALLERGIES _______ Allergies  Allergen Reactions   Morphine Nausea And Vomiting   Tape Dermatitis    Paper tape breaks me out   Vancomycin Itching and Rash   _______________________________________   Treatment:  Have you taken:  Tylenol  Yes  Advil  No  Had PT No  Had injection No  Other  ______________had cortizone injection (IM not knee)/ took prednisone  taper ___________

## 2024-08-22 DIAGNOSIS — H52223 Regular astigmatism, bilateral: Secondary | ICD-10-CM | POA: Diagnosis not present

## 2024-08-22 DIAGNOSIS — Z961 Presence of intraocular lens: Secondary | ICD-10-CM | POA: Diagnosis not present

## 2024-08-22 DIAGNOSIS — H524 Presbyopia: Secondary | ICD-10-CM | POA: Diagnosis not present

## 2024-08-22 DIAGNOSIS — H04123 Dry eye syndrome of bilateral lacrimal glands: Secondary | ICD-10-CM | POA: Diagnosis not present

## 2024-08-22 DIAGNOSIS — H16223 Keratoconjunctivitis sicca, not specified as Sjogren's, bilateral: Secondary | ICD-10-CM | POA: Diagnosis not present

## 2024-09-12 DIAGNOSIS — E782 Mixed hyperlipidemia: Secondary | ICD-10-CM | POA: Diagnosis not present

## 2024-09-19 DIAGNOSIS — Z961 Presence of intraocular lens: Secondary | ICD-10-CM | POA: Diagnosis not present

## 2024-09-19 DIAGNOSIS — H04123 Dry eye syndrome of bilateral lacrimal glands: Secondary | ICD-10-CM | POA: Diagnosis not present

## 2024-09-19 DIAGNOSIS — B0239 Other herpes zoster eye disease: Secondary | ICD-10-CM | POA: Diagnosis not present

## 2024-09-19 DIAGNOSIS — H16223 Keratoconjunctivitis sicca, not specified as Sjogren's, bilateral: Secondary | ICD-10-CM | POA: Diagnosis not present

## 2024-09-21 DIAGNOSIS — R55 Syncope and collapse: Secondary | ICD-10-CM | POA: Diagnosis not present

## 2024-09-21 DIAGNOSIS — Z853 Personal history of malignant neoplasm of breast: Secondary | ICD-10-CM | POA: Diagnosis not present

## 2024-09-21 DIAGNOSIS — E1140 Type 2 diabetes mellitus with diabetic neuropathy, unspecified: Secondary | ICD-10-CM | POA: Diagnosis not present

## 2024-09-21 DIAGNOSIS — B029 Zoster without complications: Secondary | ICD-10-CM | POA: Diagnosis not present

## 2024-09-21 DIAGNOSIS — R001 Bradycardia, unspecified: Secondary | ICD-10-CM | POA: Diagnosis not present

## 2024-09-21 DIAGNOSIS — Z0001 Encounter for general adult medical examination with abnormal findings: Secondary | ICD-10-CM | POA: Diagnosis not present

## 2024-09-21 DIAGNOSIS — Z0000 Encounter for general adult medical examination without abnormal findings: Secondary | ICD-10-CM | POA: Diagnosis not present

## 2024-09-21 DIAGNOSIS — I10 Essential (primary) hypertension: Secondary | ICD-10-CM | POA: Diagnosis not present

## 2024-09-21 DIAGNOSIS — N202 Calculus of kidney with calculus of ureter: Secondary | ICD-10-CM | POA: Diagnosis not present

## 2024-09-21 DIAGNOSIS — R002 Palpitations: Secondary | ICD-10-CM | POA: Diagnosis not present

## 2024-09-21 DIAGNOSIS — E118 Type 2 diabetes mellitus with unspecified complications: Secondary | ICD-10-CM | POA: Diagnosis not present

## 2024-09-21 DIAGNOSIS — E782 Mixed hyperlipidemia: Secondary | ICD-10-CM | POA: Diagnosis not present

## 2024-09-28 ENCOUNTER — Ambulatory Visit: Admitting: Orthopedic Surgery

## 2024-10-09 ENCOUNTER — Other Ambulatory Visit: Payer: Self-pay

## 2024-10-09 ENCOUNTER — Emergency Department (HOSPITAL_COMMUNITY): Admission: EM | Admit: 2024-10-09 | Discharge: 2024-10-09 | Disposition: A | Discharge status: Home / Self Care

## 2024-10-09 ENCOUNTER — Emergency Department (HOSPITAL_COMMUNITY)

## 2024-10-09 ENCOUNTER — Encounter (HOSPITAL_COMMUNITY): Payer: Self-pay

## 2024-10-09 DIAGNOSIS — N201 Calculus of ureter: Secondary | ICD-10-CM

## 2024-10-09 DIAGNOSIS — Z79899 Other long term (current) drug therapy: Secondary | ICD-10-CM | POA: Insufficient documentation

## 2024-10-09 DIAGNOSIS — N390 Urinary tract infection, site not specified: Secondary | ICD-10-CM | POA: Diagnosis not present

## 2024-10-09 DIAGNOSIS — N132 Hydronephrosis with renal and ureteral calculous obstruction: Secondary | ICD-10-CM | POA: Diagnosis not present

## 2024-10-09 DIAGNOSIS — R10A1 Flank pain, right side: Secondary | ICD-10-CM | POA: Diagnosis present

## 2024-10-09 LAB — CBC WITH DIFFERENTIAL/PLATELET
Abs Immature Granulocytes: 0.03 K/uL (ref 0.00–0.07)
Basophils Absolute: 0 K/uL (ref 0.0–0.1)
Basophils Relative: 1 %
Eosinophils Absolute: 0.1 K/uL (ref 0.0–0.5)
Eosinophils Relative: 2 %
HCT: 40.9 % (ref 36.0–46.0)
Hemoglobin: 13.4 g/dL (ref 12.0–15.0)
Immature Granulocytes: 0 %
Lymphocytes Relative: 20 %
Lymphs Abs: 1.6 K/uL (ref 0.7–4.0)
MCH: 29.7 pg (ref 26.0–34.0)
MCHC: 32.8 g/dL (ref 30.0–36.0)
MCV: 90.7 fL (ref 80.0–100.0)
Monocytes Absolute: 0.4 K/uL (ref 0.1–1.0)
Monocytes Relative: 5 %
Neutro Abs: 5.9 K/uL (ref 1.7–7.7)
Neutrophils Relative %: 72 %
Platelets: 321 K/uL (ref 150–400)
RBC: 4.51 MIL/uL (ref 3.87–5.11)
RDW: 15.1 % (ref 11.5–15.5)
WBC: 8 K/uL (ref 4.0–10.5)
nRBC: 0 % (ref 0.0–0.2)

## 2024-10-09 LAB — URINALYSIS, ROUTINE W REFLEX MICROSCOPIC
Bilirubin Urine: NEGATIVE
Glucose, UA: NEGATIVE mg/dL
Ketones, ur: NEGATIVE mg/dL
Nitrite: NEGATIVE
Protein, ur: 30 mg/dL — AB
RBC / HPF: >50 RBC/hpf (ref 0–5)
Specific Gravity, Urine: 1.025 (ref 1.005–1.030)
pH: 5 (ref 5.0–8.0)

## 2024-10-09 LAB — COMPREHENSIVE METABOLIC PANEL WITH GFR
ALT: 24 U/L (ref 0–44)
AST: 26 U/L (ref 15–41)
Albumin: 4.2 g/dL (ref 3.5–5.0)
Alkaline Phosphatase: 120 U/L (ref 38–126)
Anion gap: 13 (ref 5–15)
BUN: 13 mg/dL (ref 8–23)
CO2: 22 mmol/L (ref 22–32)
Calcium: 10.1 mg/dL (ref 8.9–10.3)
Chloride: 105 mmol/L (ref 98–111)
Creatinine, Ser: 0.97 mg/dL (ref 0.44–1.00)
GFR, Estimated: >60 mL/min
Glucose, Bld: 91 mg/dL (ref 70–99)
Potassium: 4 mmol/L (ref 3.5–5.1)
Sodium: 140 mmol/L (ref 135–145)
Total Bilirubin: 0.6 mg/dL (ref 0.0–1.2)
Total Protein: 7 g/dL (ref 6.5–8.1)

## 2024-10-09 LAB — LIPASE, BLOOD: Lipase: <10 U/L — ABNORMAL LOW (ref 11–51)

## 2024-10-09 MED ORDER — SODIUM CHLORIDE 0.9 % IV SOLN
2.0000 g | Freq: Once | INTRAVENOUS | Status: AC
  Administered 2024-10-09: 2 g via INTRAVENOUS
  Filled 2024-10-09: qty 20

## 2024-10-09 MED ORDER — ONDANSETRON HCL 4 MG/2ML IJ SOLN
4.0000 mg | Freq: Once | INTRAMUSCULAR | Status: AC
  Administered 2024-10-09: 4 mg via INTRAVENOUS
  Filled 2024-10-09: qty 2

## 2024-10-09 MED ORDER — OXYCODONE-ACETAMINOPHEN 5-325 MG PO TABS: 1.0000 | ORAL_TABLET | ORAL | 0 refills | Status: DC | PRN

## 2024-10-09 MED ORDER — FENTANYL CITRATE (PF) 100 MCG/2ML IJ SOLN
25.0000 ug | Freq: Once | INTRAMUSCULAR | Status: AC
  Administered 2024-10-09: 25 ug via INTRAVENOUS
  Filled 2024-10-09: qty 2

## 2024-10-09 MED ORDER — FENTANYL CITRATE (PF) 100 MCG/2ML IJ SOLN
50.0000 ug | Freq: Once | INTRAMUSCULAR | Status: AC
  Administered 2024-10-09: 50 ug via INTRAVENOUS
  Filled 2024-10-09: qty 2

## 2024-10-09 MED ORDER — CEPHALEXIN 500 MG PO CAPS: 500.0000 mg | ORAL_CAPSULE | Freq: Four times a day (QID) | ORAL | 0 refills | Status: DC

## 2024-10-09 MED ORDER — KETOROLAC TROMETHAMINE 30 MG/ML IJ SOLN
15.0000 mg | Freq: Once | INTRAMUSCULAR | Status: AC
  Administered 2024-10-09: 15 mg via INTRAVENOUS
  Filled 2024-10-09: qty 1

## 2024-10-09 MED ORDER — CEPHALEXIN 500 MG PO CAPS: 500.0000 mg | ORAL_CAPSULE | Freq: Four times a day (QID) | ORAL | 0 refills | Status: AC

## 2024-10-09 NOTE — ED Notes (Signed)
 Bladder scan showed 10ml (3) separate times, pt stated she had voided and given a urine sample previously today.

## 2024-10-09 NOTE — Discharge Instructions (Signed)
 Your workup today shows that you have a right sided kidney stone.  You also have a urinary tract infection.  You have been given antibiotics today as well as a prescription for additional antibiotics, please take as directed.  You have been prescribed pain medication, this may cause drowsiness.  Please do not operate machinery or drive while taking the medication.  You may also need to take an over-the-counter stool softener while taking the pain medication as it can cause constipation.  Please call Dr. Little office tomorrow to arrange follow-up appointment.  Return to the emergency department for any new or worsening symptoms

## 2024-10-09 NOTE — ED Triage Notes (Signed)
 Pt c/o right side flank pain, blood in urine. Pt thinks she has a kidney stone as she has had them before.

## 2024-10-09 NOTE — ED Provider Notes (Signed)
 " [place] EMERGENCY DEPARTMENT AT [facility] Provider Note   CSN: [id number] Arrival date & time: 10/09/24  1238     Patient presents with: Flank Pain   Lauren Sullivan is a 73 y.o. female.    Flank Pain        DYMIN Lauren Sullivan is a 73 y.o. female who presents to the Emergency Department complaining of right flank pain and dysuria.  Symptoms began approximately 1 week ago.  Gradually worsening.  She has had pain to her right back that has radiated to her flank area.  Diagnosed with a kidney stone earlier this year, her current pain feels similar.  She states she has some pain with voiding and has noticed blood in her urine.  Symptoms have been associated with intermittent nausea.  No fever vomiting or pain to her lower abdomen.  She also denies any chest pain or shortness of breath.  Prior to Admission medications  Medication Sig Start Date End Date Taking? Authorizing Provider  acetaminophen  (TYLENOL ) 500 MG tablet Take 1,000 mg by mouth every 6 (six) hours as needed for headache.    [provider]  amLODipine (NORVASC) 5 MG tablet Take 10 mg by mouth daily. 01/27/22   [provider]  diclofenac  Sodium (VOLTAREN  ARTHRITIS PAIN) 1 % GEL Apply 4 g topically 4 (four) times daily. 04/22/24   Leath-Warren, Etta PARAS, NP  ezetimibe (ZETIA) 10 MG tablet Take 10 mg by mouth daily. 12/15/22   [provider]  fluconazole (DIFLUCAN) 150 MG tablet Take 150 mg by mouth. As needed    [provider]  levocetirizine (XYZAL) 5 MG tablet Take 5 mg by mouth daily. 07/05/24   [provider]  losartan (COZAAR) 25 MG tablet Take 25 mg by mouth daily.    [provider]  meloxicam  (MOBIC ) 7.5 MG tablet Take 1 tablet (7.5 mg total) by mouth daily. 08/17/24   Margrette Taft BRAVO, MD  metoprolol  tartrate (LOPRESSOR ) 50 MG tablet Take 1 tablet (50 mg total) by mouth 2 (two) times daily. 10/26/23 04/05/24  Jeffrie Oneil BROCKS, MD  nitrofurantoin   (MACRODANTIN ) 50 MG capsule Take 1 capsule (50 mg total) by mouth at bedtime. 03/28/24   McKenzie, Belvie CROME, MD  nortriptyline (PAMELOR) 50 MG capsule Take 50 mg by mouth daily.     [provider]  ondansetron  (ZOFRAN ) 4 MG tablet Take 1 tablet (4 mg total) by mouth daily as needed for nausea or vomiting. 04/05/24 04/05/25  McKenzie, Belvie CROME, MD  pantoprazole (PROTONIX) 40 MG tablet Take 40 mg by mouth daily. 10/27/22   [provider]  RYBELSUS 14 MG TABS Take 1 tablet by mouth every morning. 12/24/22   [provider]  tamsulosin  (FLOMAX ) 0.4 MG CAPS capsule Take 1 capsule (0.4 mg total) by mouth daily after supper. 04/05/24   Sherrilee Belvie CROME, MD    Allergies: Morphine, Tape, and Vancomycin    Review of Systems  Genitourinary:  Positive for dysuria, flank pain and hematuria.  All other systems reviewed and are negative.   Updated Vital Signs BP (!) 157/87 (BP Location: Left Arm)   Pulse 78   Temp (!) 97.3 F (36.3 C)   Resp 17   Ht 5' 6 (1.676 m)   Wt 87.1 kg   SpO2 98%   BMI 30.99 kg/m   Physical Exam Vitals and nursing note reviewed.  Constitutional:      General: She is not in acute distress.  Appearance: Normal appearance. She is not toxic-appearing.  HENT:     Mouth/Throat:     Mouth: Mucous membranes are moist.  Cardiovascular:     Rate and Rhythm: Normal rate and regular rhythm.     Pulses: Normal pulses.  Pulmonary:     Effort: Pulmonary effort is normal.  Abdominal:     General: There is no distension.     Palpations: Abdomen is soft.     Tenderness: There is no abdominal tenderness. There is no right CVA tenderness or left CVA tenderness.  Musculoskeletal:        General: Normal range of motion.  Skin:    General: Skin is warm.     Capillary Refill: Capillary refill takes less than 2 seconds.  Neurological:     General: No focal deficit present.     Mental Status: She is alert.     Sensory: No sensory deficit.     Motor: No  weakness.     (all labs ordered are listed, but only abnormal results are displayed) Labs Reviewed  LIPASE, BLOOD - Abnormal; Notable for the following components:      Result Value   Lipase <10 (*)    All other components within normal limits  URINALYSIS, ROUTINE W REFLEX MICROSCOPIC - Abnormal; Notable for the following components:   APPearance HAZY (*)    Hgb urine dipstick LARGE (*)    Protein, ur 30 (*)    Leukocytes,Ua MODERATE (*)    Bacteria, UA RARE (*)    All other components within normal limits  URINE CULTURE  CBC WITH DIFFERENTIAL/PLATELET  COMPREHENSIVE METABOLIC PANEL WITH GFR    EKG: None  Radiology: CT Renal Stone Study Result Date: 10/09/2024 CLINICAL DATA:  Abdominal/flank pain.  Concern for kidney stone. EXAM: CT ABDOMEN AND PELVIS WITHOUT CONTRAST TECHNIQUE: Multidetector CT imaging of the abdomen and pelvis was performed following the standard protocol without IV contrast. RADIATION DOSE REDUCTION: This exam was performed according to the departmental dose-optimization program which includes automated exposure control, adjustment of the mA and/or kV according to patient size and/or use of iterative reconstruction technique. COMPARISON:  None Available. FINDINGS: Evaluation of this exam is limited in the absence of intravenous contrast. Lower chest: The visualized lung bases are clear. No intra-abdominal free air or free fluid. Hepatobiliary: The liver is unremarkable. No biliary dilatation. Cholecystectomy. Pancreas: Moderate fatty atrophy of the pancreas. No active inflammatory changes. Spleen: Normal in size without focal abnormality. Adrenals/Urinary Tract: The adrenal glands unremarkable. There is an 8 mm long stone in the proximal right ureter with mild right hydronephrosis. There is mild left pelviectasis. A 3 mm nonobstructing left renal upper pole calculus. The urinary bladder is minimally distended and grossly unremarkable Stomach/Bowel: There is sigmoid  diverticulosis. There is no bowel obstruction or active inflammation. The appendix is normal. Vascular/Lymphatic: Mild aortoiliac atherosclerotic disease. The IVC is unremarkable. No portal venous gas. There is no adenopathy. Reproductive: The uterus is anteverted. No suspicious adnexal masses. Other: None Musculoskeletal: Osteopenia with degenerative changes of the spine. No acute osseous pathology. IMPRESSION: 1. A 8 mm long stone in the proximal right ureter with mild right hydronephrosis. 2. A 3 mm nonobstructing left renal upper pole calculus. 3. Sigmoid diverticulosis. No bowel obstruction. Normal appendix. 4.  Aortic Atherosclerosis (ICD10-I70.0). Electronically Signed   By: Vanetta Chou M.D.   On: 10/09/2024 14:14     Procedures   Medications Ordered in the ED  fentaNYL  (SUBLIMAZE ) injection 50 mcg (50 mcg  Intravenous Given 10/09/24 1351)  ondansetron  (ZOFRAN ) injection 4 mg (4 mg Intravenous Given 10/09/24 1351)  fentaNYL  (SUBLIMAZE ) injection 25 mcg (25 mcg Intravenous Given 10/09/24 1501)  ketorolac  (TORADOL ) 30 MG/ML injection 15 mg (15 mg Intravenous Given 10/09/24 1501)  cefTRIAXone  (ROCEPHIN ) 2 g in sodium chloride  0.9 % 100 mL IVPB (0 g Intravenous Stopped 10/09/24 1540)                                    Medical Decision Making   Pt here from home for right flank pain for one week.,  has been gradually worsening .  She endorses having dysuria symptoms and hematuria as well.  Current sx's feel like previous kidney stone.    UTI, pyelonephritis, kidney stone, musculoskeletal pain all considered.  Suspect this is related to kidney stone  Amount and/or Complexity of Data Reviewed Labs: ordered.    Details: No evidence of leukocytosis, chemistries unremarkable.  Urinalysis shows hazy urine with large amount of blood moderate leukocytes 21-50 white cells and rare bacteria.  Urine culture is pending Radiology: ordered.    Details: CT renal stone study shows 8 mm long stone  in the proximal right ureter with mild right-sided hydronephrosis.  3 mm nonobstructing left renal pole calculus Discussion of management or test interpretation with external provider(s):  Ureteral stone with infection.  Patient is very hemodynamically stable.  No discerning symptoms for sepsis.  No CVA tenderness fever or vomiting to suggest pyelonephritis.  She has been given IV Rocephin  here, urine culture is pending.  Pain well-controlled after fentanyl  and Toradol .  She has seen Dr. Sherrilee in the past, will contact his office tomorrow to arrange follow-up appointment.  She was given strict ER return precautions as well.  Appears appropriate for discharge home at this time.  Pt also seen by Dr. Gennaro and care plan discussed.   Risk Prescription drug management.        Final diagnoses:  Ureterolithiasis  Urinary tract infection with hematuria, site unspecified    ED Discharge Orders     None          Herlinda Milling, PA-C 10/09/24 1613  "

## 2024-10-10 ENCOUNTER — Encounter: Payer: Self-pay | Admitting: Urology

## 2024-10-10 ENCOUNTER — Ambulatory Visit (HOSPITAL_COMMUNITY)
Admission: RE | Admit: 2024-10-10 | Discharge: 2024-10-10 | Disposition: A | Discharge status: Home / Self Care | Source: Ambulatory Visit | Attending: Urology | Admitting: Urology

## 2024-10-10 ENCOUNTER — Telehealth: Payer: Self-pay | Admitting: Urology

## 2024-10-10 ENCOUNTER — Ambulatory Visit: Admitting: Urology

## 2024-10-10 ENCOUNTER — Telehealth: Payer: Self-pay

## 2024-10-10 VITALS — BP 137/75 | HR 75

## 2024-10-10 DIAGNOSIS — N201 Calculus of ureter: Secondary | ICD-10-CM

## 2024-10-10 DIAGNOSIS — N200 Calculus of kidney: Secondary | ICD-10-CM

## 2024-10-10 LAB — URINALYSIS, ROUTINE W REFLEX MICROSCOPIC
Bilirubin, UA: NEGATIVE
Glucose, UA: NEGATIVE
Ketones, UA: NEGATIVE
Leukocytes,UA: NEGATIVE
Nitrite, UA: NEGATIVE
Protein,UA: NEGATIVE
Specific Gravity, UA: 1.025 (ref 1.005–1.030)
Urobilinogen, Ur: 1 mg/dL (ref 0.2–1.0)
pH, UA: 5.5 (ref 5.0–7.5)

## 2024-10-10 LAB — MICROSCOPIC EXAMINATION: Bacteria, UA: NONE SEEN

## 2024-10-10 MED ORDER — TAMSULOSIN HCL 0.4 MG PO CAPS: 0.4000 mg | ORAL_CAPSULE | Freq: Every day | ORAL | 0 refills | Status: DC

## 2024-10-10 MED ORDER — OXYCODONE-ACETAMINOPHEN 5-325 MG PO TABS: 1.0000 | ORAL_TABLET | ORAL | 0 refills | Status: AC | PRN

## 2024-10-10 NOTE — Telephone Encounter (Signed)
 Tried calling pt with no answer, LVM for return call to office.

## 2024-10-10 NOTE — Telephone Encounter (Signed)
 Had to go to ER has obstructing kidney stone and they said she will need surgery to remove it. [phone] or [phone]

## 2024-10-10 NOTE — Patient Instructions (Signed)
 ESWL for Kidney Stones  Extracorporeal shock wave lithotripsy (ESWL) is a treatment that can help break up kidney stones that are too large to pass on their own.  This is a nonsurgical procedure that breaks up a kidney stone with shock waves. These shock waves pass through your body and focus on the kidney stone. They cause the kidney stone to break into smaller pieces (fragments) while it is still in the urinary tract. The fragments of stone can pass more easily out of your body in the pee (urine). Tell a health care provider about: Any allergies you have. All medicines you are taking, including vitamins, herbs, eye drops, creams, and over-the-counter medicines. Any problems you or family members have had with anesthetic medicines. Any bleeding problems you have. Any surgeries you've had. Any medical conditions you have. Whether you're pregnant or may be pregnant. What are the risks? Your health care provider will talk with you about risks. These may include: Infection. Bleeding from the kidney. Bruising of the kidney or skin. Scarring of the kidney. This can lead to: Increased blood pressure. Poor kidney function. Return (recurrence) of kidney stones. Damage to other structures or organs. This may include the liver, colon, spleen, or pancreas. Blockage (obstruction) of the tube that carries pee from the kidney to the bladder (ureter). Failure of the kidney stone to break into fragments. What happens before the procedure? When to stop eating and drinking Follow instructions from your health care provider about what you may eat and drink. These may include: 8 hours before your procedure Stop eating most foods. Do not eat meat, fried foods, or fatty foods. Eat only light foods, such as toast or crackers. All liquids are okay except energy drinks and alcohol. 6 hours before your procedure Stop eating. Drink only clear liquids, such as water, clear fruit juice, black coffee, plain tea,  and sports drinks. Do not drink energy drinks or alcohol. 2 hours before your procedure Stop drinking all liquids. You may be allowed to take medicines with small sips of water. If you do not follow your health care provider's instructions, your procedure may be delayed or canceled. Medicines Ask your health care provider about: Changing or stopping your regular medicines. These include any diabetes medicines or blood thinners you take. Taking medicines such as aspirin and ibuprofen. These medicines can thin your blood. Do not take them unless your health care provider tells you to. Taking over-the-counter medicines, vitamins, herbs, and supplements. Tests You may have tests, such as: Blood tests. Pee (urine) tests. Imaging tests. This may include a CT scan. Surgery safety Ask your health care provider: How your surgery site will be marked. What steps will be taken to help prevent infection. These steps may include: Washing skin with a soap that kills germs. Receiving antibiotics. General instructions If you will be going home right after the procedure, plan to have a responsible adult: Take you home from the hospital or clinic. You will not be allowed to drive. Care for you for the time you are told. What happens during the procedure?  An IV will be inserted into one of your veins. You may be given: A sedative. This helps you relax. Anesthesia. This will: Numb certain areas of your body. Make you fall asleep for surgery. A water-filled cushion may be placed behind your kidney or on your abdomen. In some cases, you may be placed in a tub of lukewarm water. Your body will be positioned in a way that makes it  easier to target the kidney stone. An X-ray or ultrasound exam will be done to locate your stone. Shock waves will be aimed at the stone. If you are awake, you may feel a tapping sensation as the shock waves pass through your body. A small mesh tube (stent) may be placed in  your ureter. This will help keep pee flowing from the kidney if the fragments of the stone have been blocking the ureter. The stent will be removed at a later time by your health care provider. The procedure may vary among health care providers and hospitals. What happens after the procedure? Your blood pressure, heart rate, breathing rate, and blood oxygen level will be monitored until you leave the hospital or clinic. You may have an X-ray after the procedure to see how many of the kidney stones were broken up. This will also show how much of the stone has passed. If there are still large fragments after treatment, you may need to have a second procedure at a later time. This information is not intended to replace advice given to you by your health care provider. Make sure you discuss any questions you have with your health care provider. Document Revised: 04/18/2023 Document Reviewed: 02/06/2022 Elsevier Patient Education  2024 [employer].

## 2024-10-10 NOTE — Progress Notes (Signed)
 "  10/10/2024 11:21 AM   Lauren Sullivan October 18, 1951 [id number]  Referring provider: Shona Norleen PEDLAR, MD [address] [place],  [place] [address]  nephrolithiasis   HPI: Lauren Sullivan is a 73yo here for followup for nephrolithiasis. She developed right flank pain on Friday and presented to the ER yesterday and was diagnosed with a 8mm right proximal ureteral calculus. She has had hematuria for 1 week. She started flomax  on Friday.  Currently she has sharp, intermittent mild to moderate, nonradiating. KUb today shows right proximal ureteral calculus.    PMH: Past Medical History:  Diagnosis Date   Arthritis    Blood transfusion without reported diagnosis    Breast CA (HCC)    Cataract    Diabetes mellitus without complication (HCC)    GERD (gastroesophageal reflux disease)    History of kidney stones    Hypertension     Surgical History: Past Surgical History:  Procedure Laterality Date   BIOPSY  01/02/2020   Procedure: BIOPSY;  Surgeon: Shaaron Lamar HERO, MD;  Location: [facility];  Service: Endoscopy;;  colon   BREAST BIOPSY Left 01/19/2023   MM LT BREAST BX W LOC DEV 1ST LESION IMAGE BX SPEC STEREO GUIDE 01/19/2023 [facility]   BREAST SURGERY     CHOLECYSTECTOMY     COLONOSCOPY  2008   Dr. Shaaron: Minimally friable anal canal, patient only boggy edematous terminal ileum.  Biopsies from the terminal ileum were benign.  Random colon biopsies benign.  No evidence of microscopic colitis or inflammatory bowel disease.   COLONOSCOPY WITH PROPOFOL  N/A 01/02/2020   Rourk: 2 tubular adenomas removed, random colon biopsies negative.  Next colonoscopy 5 years.   EXTRACORPOREAL SHOCK WAVE LITHOTRIPSY Left 04/05/2024   Procedure: LITHOTRIPSY, ESWL;  Surgeon: Sherrilee Belvie CROME, MD;  Location: [facility];  Service: Urology;  Laterality: Left;   EYE SURGERY Bilateral    MASTECTOMY Right    POLYPECTOMY  01/02/2020   Procedure: POLYPECTOMY;  Surgeon: Shaaron Lamar HERO, MD;  Location: [facility];  Service: Endoscopy;;   TUBAL LIGATION      Home Medications:  Allergies as of 10/10/2024       Reactions   Morphine Nausea And Vomiting   Tape Dermatitis   Paper tape breaks me out   Vancomycin Itching, Rash        Medication List        Accurate as of October 10, 2024 11:21 AM. If you have any questions, ask your nurse or doctor.          acetaminophen  500 MG tablet Commonly known as: TYLENOL  Take 1,000 mg by mouth every 6 (six) hours as needed for headache.   amLODipine 5 MG tablet Commonly known as: NORVASC Take 10 mg by mouth daily.   cephALEXin  500 MG capsule Commonly known as: KEFLEX  Take 1 capsule (500 mg total) by mouth 4 (four) times daily.   diclofenac  Sodium 1 % Gel Commonly known as: Voltaren  Arthritis Pain Apply 4 g topically 4 (four) times daily.   ezetimibe 10 MG tablet Commonly known as: ZETIA Take 10 mg by mouth daily.   fluconazole 150 MG tablet Commonly known as: DIFLUCAN Take 150 mg by mouth. As needed   levocetirizine 5 MG tablet Commonly known as: XYZAL Take 5 mg by mouth daily.   losartan 25 MG tablet Commonly known as: COZAAR Take 25 mg by mouth daily.   meloxicam  7.5 MG tablet Commonly known as: Mobic  Take 1  tablet (7.5 mg total) by mouth daily.   metoprolol  tartrate 50 MG tablet Commonly known as: LOPRESSOR  Take 1 tablet (50 mg total) by mouth 2 (two) times daily.   nitrofurantoin  50 MG capsule Commonly known as: Macrodantin  Take 1 capsule (50 mg total) by mouth at bedtime.   nortriptyline 50 MG capsule Commonly known as: PAMELOR Take 50 mg by mouth daily.   ondansetron  4 MG tablet Commonly known as: Zofran  Take 1 tablet (4 mg total) by mouth daily as needed for nausea or vomiting.   oxyCODONE -acetaminophen  5-325 MG tablet Commonly known as: PERCOCET/ROXICET Take 1 tablet by mouth every 4 (four) hours as needed.   pantoprazole 40 MG tablet Commonly known as: PROTONIX Take 40 mg by mouth daily.    Rybelsus 14 MG Tabs Generic drug: Semaglutide Take 1 tablet by mouth every morning.   tamsulosin  0.4 MG Caps capsule Commonly known as: Flomax  Take 1 capsule (0.4 mg total) by mouth daily after supper.        Allergies: Allergies[1]  Family History: Family History  Problem Relation Age of Onset   Cancer Mother        pancreatic   Heart disease Mother    Hypertension Mother    Hypertension Father    Colon cancer Neg Hx     Social History:  reports that she has quit smoking. She has never been exposed to tobacco smoke. She has never used smokeless tobacco. She reports that she does not drink alcohol and does not use drugs.  ROS: All other review of systems were reviewed and are negative except what is noted above in HPI  Physical Exam: BP 137/75   Pulse 75   Constitutional:  Alert and oriented, No acute distress. HEENT: [place] AT, moist mucus membranes.  Trachea midline, no masses. Cardiovascular: No clubbing, cyanosis, or edema. Respiratory: Normal respiratory effort, no increased work of breathing. GI: Abdomen is soft, nontender, nondistended, no abdominal masses GU: No CVA tenderness.  Lymph: No cervical or inguinal lymphadenopathy. Skin: No rashes, bruises or suspicious lesions. Neurologic: Grossly intact, no focal deficits, moving all 4 extremities. Psychiatric: Normal mood and affect.  Laboratory Data: Lab Results  Component Value Date   WBC 8.0 10/09/2024   HGB 13.4 10/09/2024   HCT 40.9 10/09/2024   MCV 90.7 10/09/2024   PLT 321 10/09/2024    Lab Results  Component Value Date   CREATININE 0.97 10/09/2024    No results found for: PSA  No results found for: TESTOSTERONE  Lab Results  Component Value Date   HGBA1C 6.2 (A) 08/22/2019    Urinalysis    Component Value Date/Time   COLORURINE YELLOW 10/09/2024 1320   APPEARANCEUR HAZY (A) 10/09/2024 1320   APPEARANCEUR Clear 04/20/2024 0859   LABSPEC 1.025 10/09/2024 1320   PHURINE 5.0  10/09/2024 1320   GLUCOSEU NEGATIVE 10/09/2024 1320   HGBUR LARGE (A) 10/09/2024 1320   BILIRUBINUR NEGATIVE 10/09/2024 1320   BILIRUBINUR Negative 04/20/2024 0859   KETONESUR NEGATIVE 10/09/2024 1320   PROTEINUR 30 (A) 10/09/2024 1320   UROBILINOGEN 0.2 02/08/2011 1638   NITRITE NEGATIVE 10/09/2024 1320   LEUKOCYTESUR MODERATE (A) 10/09/2024 1320    Lab Results  Component Value Date   LABMICR Comment 04/20/2024   WBCUA 6-10 (A) 11/30/2023   LABEPIT 0-10 11/30/2023   MUCUS Present (A) 11/30/2023   BACTERIA RARE (A) 10/09/2024    Pertinent Imaging: KUb today: Images reviewed and discussed with the patient  Results for orders placed during the hospital  encounter of 10/10/24  Abdomen 1 view (KUB)  Narrative CLINICAL DATA:  Right ureteral calculus.  EXAM: ABDOMEN - 1 VIEW  COMPARISON:  CT on 10/09/2024  FINDINGS: The bowel gas pattern is normal. The proximal right ureteral calculus seen on recent CT is not visualized on this exam. No other radiopaque urinary calculi identified. Right upper quadrant surgical clips noted from prior cholecyste other ctomy.  IMPRESSION: No radiopaque urinary calculi identified.   Electronically Signed By: Norleen DELENA Kil M.D. On: 10/10/2024 09:43  No results found for this or any previous visit.  No results found for this or any previous visit.  No results found for this or any previous visit.  Results for orders placed during the hospital encounter of 02/26/24  US  RENAL  Narrative CLINICAL DATA:  Nephrolithiasis.  EXAM: RENAL / URINARY TRACT ULTRASOUND COMPLETE  COMPARISON:  June 01, 2023  FINDINGS: Right Kidney:  Renal measurements: 10.2 x 4.7 x 4.7 cm = volume: 118.4 mL. Echogenicity within normal limits. No mass or hydronephrosis visualized.  Left Kidney:  Renal measurements: 9.8 x 5 x 5.2 cm = volume: 132.8 mL. 5.6 mm nonobstructing calcification identified in the lower pole left kidney. Echogenicity within  normal limits. No mass or hydronephrosis visualized.  Bladder:  Appears normal for degree of bladder distention. Bilateral ureteral jets are noted.  Other:  None.  IMPRESSION: 1. No acute abnormality identified. 2. 5.6 mm nonobstructing calcification identified in the lower pole left kidney.   Electronically Signed By: Craig Farr M.D. On: 02/26/2024 15:23  No results found for this or any previous visit.  No results found for this or any previous visit.  Results for orders placed during the hospital encounter of 10/09/24  CT Renal Stone Study  Narrative CLINICAL DATA:  Abdominal/flank pain.  Concern for kidney stone.  EXAM: CT ABDOMEN AND PELVIS WITHOUT CONTRAST  TECHNIQUE: Multidetector CT imaging of the abdomen and pelvis was performed following the standard protocol without IV contrast.  RADIATION DOSE REDUCTION: This exam was performed according to the departmental dose-optimization program which includes automated exposure control, adjustment of the mA and/or kV according to patient size and/or use of iterative reconstruction technique.  COMPARISON:  None Available.  FINDINGS: Evaluation of this exam is limited in the absence of intravenous contrast.  Lower chest: The visualized lung bases are clear.  No intra-abdominal free air or free fluid.  Hepatobiliary: The liver is unremarkable. No biliary dilatation. Cholecystectomy.  Pancreas: Moderate fatty atrophy of the pancreas. No active inflammatory changes.  Spleen: Normal in size without focal abnormality.  Adrenals/Urinary Tract: The adrenal glands unremarkable. There is an 8 mm long stone in the proximal right ureter with mild right hydronephrosis. There is mild left pelviectasis. A 3 mm nonobstructing left renal upper pole calculus. The urinary bladder is minimally distended and grossly unremarkable  Stomach/Bowel: There is sigmoid diverticulosis. There is no bowel obstruction or active  inflammation. The appendix is normal.  Vascular/Lymphatic: Mild aortoiliac atherosclerotic disease. The IVC is unremarkable. No portal venous gas. There is no adenopathy.  Reproductive: The uterus is anteverted. No suspicious adnexal masses.  Other: None  Musculoskeletal: Osteopenia with degenerative changes of the spine. No acute osseous pathology.  IMPRESSION: 1. A 8 mm long stone in the proximal right ureter with mild right hydronephrosis. 2. A 3 mm nonobstructing left renal upper pole calculus. 3. Sigmoid diverticulosis. No bowel obstruction. Normal appendix. 4.  Aortic Atherosclerosis (ICD10-I70.0).   Electronically Signed By: Vanetta Chou M.D. On: 10/09/2024  14:14   Assessment & Plan:    1. Nephrolithiasis (Primary) -We discussed the management of kidney stones. These options include observation, ureteroscopy, shockwave lithotripsy (ESWL) and percutaneous nephrolithotomy (PCNL). We discussed which options are relevant to the patient's stone(s). We discussed the natural history of kidney stones as well as the complications of untreated stones and the impact on quality of life without treatment as well as with each of the above listed treatments. We also discussed the efficacy of each treatment in its ability to clear the stone burden. With any of these management options I discussed the signs and symptoms of infection and the need for emergent treatment should these be experienced. For each option we discussed the ability of each procedure to clear the patient of their stone burden.   For observation I described the risks which include but are not limited to silent renal damage, life-threatening infection, need for emergent surgery, failure to pass stone and pain.   For ureteroscopy I described the risks which include bleeding, infection, damage to contiguous structures, positioning injury, ureteral stricture, ureteral avulsion, ureteral injury, need for prolonged ureteral  stent, inability to perform ureteroscopy, need for an interval procedure, inability to clear stone burden, stent discomfort/pain, heart attack, stroke, pulmonary embolus and the inherent risks with general anesthesia.   For shockwave lithotripsy I described the risks which include arrhythmia, kidney contusion, kidney hemorrhage, need for transfusion, pain, inability to adequately break up stone, inability to pass stone fragments, Steinstrasse, infection associated with obstructing stones, need for alternate surgical procedure, need for repeat shockwave lithotripsy, MI, CVA, PE and the inherent risks with anesthesia/conscious sedation.   For PCNL I described the risks including positioning injury, pneumothorax, hydrothorax, need for chest tube, inability to clear stone burden, renal laceration, arterial venous fistula or malformation, need for embolization of kidney, loss of kidney or renal function, need for repeat procedure, need for prolonged nephrostomy tube, ureteral avulsion, MI, CVA, PE and the inherent risks of general anesthesia.   - The patient would like to proceed with right ESWL - Urinalysis, Routine w reflex microscopic   No follow-ups on file.  Belvie Clara, MD  [facility] [place]      [1]  Allergies Allergen Reactions   Morphine Nausea And Vomiting   Tape Dermatitis    Paper tape breaks me out   Vancomycin Itching and Rash   "

## 2024-10-10 NOTE — Telephone Encounter (Signed)
 SABRA

## 2024-10-11 LAB — URINE CULTURE: Culture: NO GROWTH

## 2024-10-11 NOTE — Telephone Encounter (Signed)
 Patient seen by MD McKenzie after phone call pt scheduled for [facility]

## 2024-10-13 ENCOUNTER — Other Ambulatory Visit: Payer: Self-pay | Admitting: Cardiology

## 2024-10-17 ENCOUNTER — Other Ambulatory Visit: Payer: Self-pay

## 2024-10-17 NOTE — Progress Notes (Signed)
 Opened in error.  Xray completed on 12/22

## 2024-10-26 ENCOUNTER — Ambulatory Visit (HOSPITAL_COMMUNITY)
Admission: RE | Admit: 2024-10-26 | Discharge: 2024-10-26 | Disposition: A | Discharge status: Home / Self Care | Source: Ambulatory Visit | Attending: Urology | Admitting: Urology

## 2024-10-26 ENCOUNTER — Other Ambulatory Visit: Payer: Self-pay

## 2024-10-26 DIAGNOSIS — N200 Calculus of kidney: Secondary | ICD-10-CM | POA: Diagnosis present

## 2024-10-28 ENCOUNTER — Ambulatory Visit: Admitting: Urology

## 2024-10-28 VITALS — BP 157/82 | HR 80

## 2024-10-28 DIAGNOSIS — N200 Calculus of kidney: Secondary | ICD-10-CM | POA: Diagnosis not present

## 2024-10-28 DIAGNOSIS — N3000 Acute cystitis without hematuria: Secondary | ICD-10-CM

## 2024-10-28 LAB — URINALYSIS, ROUTINE W REFLEX MICROSCOPIC
Bilirubin, UA: NEGATIVE
Glucose, UA: NEGATIVE
Ketones, UA: NEGATIVE
Nitrite, UA: NEGATIVE
Protein,UA: NEGATIVE
Specific Gravity, UA: 1.015 (ref 1.005–1.030)
Urobilinogen, Ur: 0.2 mg/dL (ref 0.2–1.0)
pH, UA: 6.5 (ref 5.0–7.5)

## 2024-10-28 LAB — MICROSCOPIC EXAMINATION: Epithelial Cells (non renal): >10 /HPF — AB (ref 0–10)

## 2024-10-28 MED ORDER — TAMSULOSIN HCL 0.4 MG PO CAPS: 0.4000 mg | ORAL_CAPSULE | Freq: Every day | ORAL | 0 refills | Status: AC

## 2024-10-28 MED ORDER — NITROFURANTOIN MACROCRYSTAL 50 MG PO CAPS: 50.0000 mg | ORAL_CAPSULE | Freq: Every day | ORAL | 3 refills | Status: AC

## 2024-10-28 NOTE — Progress Notes (Unsigned)
 "  10/28/2024 9:06 AM   Lauren Sullivan 02/19/1951 [id number]  Referring provider: Shona Norleen PEDLAR, MD [address] [place],  [place] [address]  No chief complaint on file.   HPI: KUB shows right mid ureteral calculus. She has mild right flank pain which is dull. She brought fragments with her today.    PMH: Past Medical History:  Diagnosis Date   Arthritis    Blood transfusion without reported diagnosis    Breast CA (HCC)    Cataract    Diabetes mellitus without complication (HCC)    GERD (gastroesophageal reflux disease)    History of kidney stones    Hypertension     Surgical History: Past Surgical History:  Procedure Laterality Date   BIOPSY  01/02/2020   Procedure: BIOPSY;  Surgeon: Shaaron Lamar HERO, MD;  Location: [facility];  Service: Endoscopy;;  colon   BREAST BIOPSY Left 01/19/2023   MM LT BREAST BX W LOC DEV 1ST LESION IMAGE BX SPEC STEREO GUIDE 01/19/2023 [facility]   BREAST SURGERY     CHOLECYSTECTOMY     COLONOSCOPY  2008   Dr. Shaaron: Minimally friable anal canal, patient only boggy edematous terminal ileum.  Biopsies from the terminal ileum were benign.  Random colon biopsies benign.  No evidence of microscopic colitis or inflammatory bowel disease.   COLONOSCOPY WITH PROPOFOL  N/A 01/02/2020   Rourk: 2 tubular adenomas removed, random colon biopsies negative.  Next colonoscopy 5 years.   EXTRACORPOREAL SHOCK WAVE LITHOTRIPSY Left 04/05/2024   Procedure: LITHOTRIPSY, ESWL;  Surgeon: Sherrilee Belvie CROME, MD;  Location: [facility];  Service: Urology;  Laterality: Left;   EYE SURGERY Bilateral    MASTECTOMY Right    POLYPECTOMY  01/02/2020   Procedure: POLYPECTOMY;  Surgeon: Shaaron Lamar HERO, MD;  Location: [facility];  Service: Endoscopy;;   TUBAL LIGATION      Home Medications:  Allergies as of 10/28/2024       Reactions   Morphine Nausea And Vomiting   Tape Dermatitis   Paper tape breaks me out   Vancomycin Itching, Rash         Medication List        Accurate as of October 28, 2024  9:06 AM. If you have any questions, ask your nurse or doctor.          acetaminophen  500 MG tablet Commonly known as: TYLENOL  Take 1,000 mg by mouth every 6 (six) hours as needed for headache.   amLODipine 5 MG tablet Commonly known as: NORVASC Take 10 mg by mouth daily.   cephALEXin  500 MG capsule Commonly known as: KEFLEX  Take 1 capsule (500 mg total) by mouth 4 (four) times daily.   diclofenac  Sodium 1 % Gel Commonly known as: Voltaren  Arthritis Pain Apply 4 g topically 4 (four) times daily.   ezetimibe 10 MG tablet Commonly known as: ZETIA Take 10 mg by mouth daily.   fluconazole 150 MG tablet Commonly known as: DIFLUCAN Take 150 mg by mouth. As needed   levocetirizine 5 MG tablet Commonly known as: XYZAL Take 5 mg by mouth daily.   losartan 25 MG tablet Commonly known as: COZAAR Take 25 mg by mouth daily.   meloxicam  7.5 MG tablet Commonly known as: Mobic  Take 1 tablet (7.5 mg total) by mouth daily.   metoprolol  tartrate 50 MG tablet Commonly known as: LOPRESSOR  TAKE ONE TABLET BY MOUTH TWICE DAILY   nitrofurantoin  50 MG capsule Commonly known as: Macrodantin  Take  1 capsule (50 mg total) by mouth at bedtime.   nortriptyline 50 MG capsule Commonly known as: PAMELOR Take 50 mg by mouth daily.   ondansetron  4 MG tablet Commonly known as: Zofran  Take 1 tablet (4 mg total) by mouth daily as needed for nausea or vomiting.   oxyCODONE -acetaminophen  5-325 MG tablet Commonly known as: PERCOCET/ROXICET Take 1 tablet by mouth every 4 (four) hours as needed.   pantoprazole 40 MG tablet Commonly known as: PROTONIX Take 40 mg by mouth daily.   Rybelsus 14 MG Tabs Generic drug: Semaglutide Take 1 tablet by mouth every morning.   tamsulosin  0.4 MG Caps capsule Commonly known as: Flomax  Take 1 capsule (0.4 mg total) by mouth daily after supper.        Allergies: Allergies[1]  Family  History: Family History  Problem Relation Age of Onset   Cancer Mother        pancreatic   Heart disease Mother    Hypertension Mother    Hypertension Father    Colon cancer Neg Hx     Social History:  reports that she has quit smoking. She has never been exposed to tobacco smoke. She has never used smokeless tobacco. She reports that she does not drink alcohol and does not use drugs.  ROS: All other review of systems were reviewed and are negative except what is noted above in HPI  Physical Exam: BP (!) 157/82   Pulse 80   Constitutional:  Alert and oriented, No acute distress. HEENT: [place] AT, moist mucus membranes.  Trachea midline, no masses. Cardiovascular: No clubbing, cyanosis, or edema. Respiratory: Normal respiratory effort, no increased work of breathing. GI: Abdomen is soft, nontender, nondistended, no abdominal masses GU: No CVA tenderness.  Lymph: No cervical or inguinal lymphadenopathy. Skin: No rashes, bruises or suspicious lesions. Neurologic: Grossly intact, no focal deficits, moving all 4 extremities. Psychiatric: Normal mood and affect.  Laboratory Data: Lab Results  Component Value Date   WBC 8.0 10/09/2024   HGB 13.4 10/09/2024   HCT 40.9 10/09/2024   MCV 90.7 10/09/2024   PLT 321 10/09/2024    Lab Results  Component Value Date   CREATININE 0.97 10/09/2024    No results found for: PSA  No results found for: TESTOSTERONE  Lab Results  Component Value Date   HGBA1C 6.2 (A) 08/22/2019    Urinalysis    Component Value Date/Time   COLORURINE YELLOW 10/09/2024 1320   APPEARANCEUR Clear 10/10/2024 1027   LABSPEC 1.025 10/09/2024 1320   PHURINE 5.0 10/09/2024 1320   GLUCOSEU Negative 10/10/2024 1027   HGBUR LARGE (A) 10/09/2024 1320   BILIRUBINUR Negative 10/10/2024 1027   KETONESUR NEGATIVE 10/09/2024 1320   PROTEINUR Negative 10/10/2024 1027   PROTEINUR 30 (A) 10/09/2024 1320   UROBILINOGEN 0.2 02/08/2011 1638   NITRITE Negative  10/10/2024 1027   NITRITE NEGATIVE 10/09/2024 1320   LEUKOCYTESUR Negative 10/10/2024 1027   LEUKOCYTESUR MODERATE (A) 10/09/2024 1320    Lab Results  Component Value Date   LABMICR See below: 10/10/2024   WBCUA 0-5 10/10/2024   LABEPIT 0-10 10/10/2024   MUCUS Present (A) 11/30/2023   BACTERIA None seen 10/10/2024    Pertinent Imaging: *** Results for orders placed during the hospital encounter of 10/10/24  Abdomen 1 view (KUB)  Narrative CLINICAL DATA:  Right ureteral calculus.  EXAM: ABDOMEN - 1 VIEW  COMPARISON:  CT on 10/09/2024  FINDINGS: The bowel gas pattern is normal. The proximal right ureteral calculus seen on recent  CT is not visualized on this exam. No other radiopaque urinary calculi identified. Right upper quadrant surgical clips noted from prior cholecyste other ctomy.  IMPRESSION: No radiopaque urinary calculi identified.   Electronically Signed By: Norleen DELENA Kil M.D. On: 10/10/2024 09:43  No results found for this or any previous visit.  No results found for this or any previous visit.  No results found for this or any previous visit.  Results for orders placed during the hospital encounter of 02/26/24  US  RENAL  Narrative CLINICAL DATA:  Nephrolithiasis.  EXAM: RENAL / URINARY TRACT ULTRASOUND COMPLETE  COMPARISON:  June 01, 2023  FINDINGS: Right Kidney:  Renal measurements: 10.2 x 4.7 x 4.7 cm = volume: 118.4 mL. Echogenicity within normal limits. No mass or hydronephrosis visualized.  Left Kidney:  Renal measurements: 9.8 x 5 x 5.2 cm = volume: 132.8 mL. 5.6 mm nonobstructing calcification identified in the lower pole left kidney. Echogenicity within normal limits. No mass or hydronephrosis visualized.  Bladder:  Appears normal for degree of bladder distention. Bilateral ureteral jets are noted.  Other:  None.  IMPRESSION: 1. No acute abnormality identified. 2. 5.6 mm nonobstructing calcification identified in  the lower pole left kidney.   Electronically Signed By: Craig Farr M.D. On: 02/26/2024 15:23  No results found for this or any previous visit.  No results found for this or any previous visit.  Results for orders placed during the hospital encounter of 10/09/24  CT Renal Stone Study  Narrative CLINICAL DATA:  Abdominal/flank pain.  Concern for kidney stone.  EXAM: CT ABDOMEN AND PELVIS WITHOUT CONTRAST  TECHNIQUE: Multidetector CT imaging of the abdomen and pelvis was performed following the standard protocol without IV contrast.  RADIATION DOSE REDUCTION: This exam was performed according to the departmental dose-optimization program which includes automated exposure control, adjustment of the mA and/or kV according to patient size and/or use of iterative reconstruction technique.  COMPARISON:  None Available.  FINDINGS: Evaluation of this exam is limited in the absence of intravenous contrast.  Lower chest: The visualized lung bases are clear.  No intra-abdominal free air or free fluid.  Hepatobiliary: The liver is unremarkable. No biliary dilatation. Cholecystectomy.  Pancreas: Moderate fatty atrophy of the pancreas. No active inflammatory changes.  Spleen: Normal in size without focal abnormality.  Adrenals/Urinary Tract: The adrenal glands unremarkable. There is an 8 mm long stone in the proximal right ureter with mild right hydronephrosis. There is mild left pelviectasis. A 3 mm nonobstructing left renal upper pole calculus. The urinary bladder is minimally distended and grossly unremarkable  Stomach/Bowel: There is sigmoid diverticulosis. There is no bowel obstruction or active inflammation. The appendix is normal.  Vascular/Lymphatic: Mild aortoiliac atherosclerotic disease. The IVC is unremarkable. No portal venous gas. There is no adenopathy.  Reproductive: The uterus is anteverted. No suspicious adnexal masses.  Other:  None  Musculoskeletal: Osteopenia with degenerative changes of the spine. No acute osseous pathology.  IMPRESSION: 1. A 8 mm long stone in the proximal right ureter with mild right hydronephrosis. 2. A 3 mm nonobstructing left renal upper pole calculus. 3. Sigmoid diverticulosis. No bowel obstruction. Normal appendix. 4.  Aortic Atherosclerosis (ICD10-I70.0).   Electronically Signed By: Vanetta Chou M.D. On: 10/09/2024 14:14   Assessment & Plan:    1. Nephrolithiasis (Primary) *** - Urinalysis, Routine w reflex microscopic   No follow-ups on file.  Belvie Clara, MD  [facility] [place]      [1]  Allergies Allergen Reactions  Morphine Nausea And Vomiting   Tape Dermatitis    Paper tape breaks me out   Vancomycin Itching and Rash   "

## 2024-11-01 ENCOUNTER — Encounter: Payer: Self-pay | Admitting: Urology

## 2024-11-01 NOTE — Patient Instructions (Signed)

## 2024-11-08 LAB — STONE ANALYSIS
Calcium Oxalate Dihydrate: 70 %
Calcium Oxalate Monohydrate: 25 %
Calcium Phosphate (Hydroxyl): 5 %
Weight Calculi: 9 mg

## 2024-11-09 ENCOUNTER — Ambulatory Visit (HOSPITAL_COMMUNITY)
Admission: RE | Admit: 2024-11-09 | Discharge: 2024-11-09 | Disposition: A | Discharge status: Home / Self Care | Source: Ambulatory Visit | Attending: Urology | Admitting: Urology

## 2024-11-09 DIAGNOSIS — N200 Calculus of kidney: Secondary | ICD-10-CM | POA: Diagnosis present

## 2024-11-15 ENCOUNTER — Ambulatory Visit: Payer: Self-pay

## 2024-11-15 ENCOUNTER — Other Ambulatory Visit: Payer: Self-pay

## 2024-11-15 DIAGNOSIS — N200 Calculus of kidney: Secondary | ICD-10-CM

## 2024-11-15 NOTE — Telephone Encounter (Signed)
 Spoke with patient via phone.

## 2024-11-17 ENCOUNTER — Other Ambulatory Visit (HOSPITAL_COMMUNITY)

## 2024-11-24 ENCOUNTER — Encounter (HOSPITAL_COMMUNITY): Payer: Self-pay

## 2024-11-24 ENCOUNTER — Encounter (HOSPITAL_COMMUNITY): Admission: RE | Admit: 2024-11-24 | Discharge: 2024-11-24 | Attending: Urology

## 2024-11-24 ENCOUNTER — Other Ambulatory Visit: Payer: Self-pay

## 2024-11-25 ENCOUNTER — Encounter (INDEPENDENT_AMBULATORY_CARE_PROVIDER_SITE_OTHER): Payer: Self-pay | Admitting: *Deleted

## 2024-11-29 ENCOUNTER — Encounter (HOSPITAL_COMMUNITY): Admission: RE | Payer: Self-pay | Source: Home / Self Care

## 2024-11-29 ENCOUNTER — Ambulatory Visit (HOSPITAL_COMMUNITY): Admission: RE | Admit: 2024-11-29 | Source: Home / Self Care | Admitting: Urology

## 2024-11-29 DIAGNOSIS — N202 Calculus of kidney with calculus of ureter: Secondary | ICD-10-CM

## 2025-01-25 ENCOUNTER — Other Ambulatory Visit (HOSPITAL_COMMUNITY)

## 2025-02-15 ENCOUNTER — Ambulatory Visit: Admitting: Urology
